# Patient Record
Sex: Female | Born: 1945 | Race: White | Hispanic: No | Smoking: Never smoker
Health system: Southern US, Community
[De-identification: ages and names within clinical notes are randomized; demographics above are authoritative.]

## PROBLEM LIST (undated history)

## (undated) DIAGNOSIS — M810 Age-related osteoporosis without current pathological fracture: Secondary | ICD-10-CM

## (undated) DIAGNOSIS — M1990 Unspecified osteoarthritis, unspecified site: Secondary | ICD-10-CM

## (undated) DIAGNOSIS — K219 Gastro-esophageal reflux disease without esophagitis: Secondary | ICD-10-CM

## (undated) DIAGNOSIS — E785 Hyperlipidemia, unspecified: Secondary | ICD-10-CM

## (undated) DIAGNOSIS — F32A Depression, unspecified: Secondary | ICD-10-CM

## (undated) DIAGNOSIS — R4189 Other symptoms and signs involving cognitive functions and awareness: Secondary | ICD-10-CM

## (undated) DIAGNOSIS — I7300 Raynaud's syndrome without gangrene: Secondary | ICD-10-CM

## (undated) DIAGNOSIS — F419 Anxiety disorder, unspecified: Secondary | ICD-10-CM

## (undated) DIAGNOSIS — R42 Dizziness and giddiness: Secondary | ICD-10-CM

## (undated) DIAGNOSIS — F329 Major depressive disorder, single episode, unspecified: Secondary | ICD-10-CM

## (undated) DIAGNOSIS — I10 Essential (primary) hypertension: Secondary | ICD-10-CM

## (undated) HISTORY — DX: Other symptoms and signs involving cognitive functions and awareness: R41.89

## (undated) HISTORY — DX: Unspecified osteoarthritis, unspecified site: M19.90

## (undated) HISTORY — DX: Essential (primary) hypertension: I10

## (undated) HISTORY — DX: Gastro-esophageal reflux disease without esophagitis: K21.9

## (undated) HISTORY — PX: RHINOPLASTY: SUR1284

## (undated) HISTORY — DX: Raynaud's syndrome without gangrene: I73.00

## (undated) HISTORY — DX: Dizziness and giddiness: R42

## (undated) HISTORY — DX: Anxiety disorder, unspecified: F41.9

## (undated) HISTORY — DX: Hyperlipidemia, unspecified: E78.5

## (undated) HISTORY — DX: Age-related osteoporosis without current pathological fracture: M81.0

## (undated) HISTORY — DX: Depression, unspecified: F32.A

---
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
## 2012-06-09 ENCOUNTER — Ambulatory Visit (HOSPITAL_BASED_OUTPATIENT_CLINIC_OR_DEPARTMENT_OTHER): Admit: 2012-06-09 | Payer: Self-pay | Admitting: Neurosurgery

## 2012-06-09 ENCOUNTER — Inpatient Hospital Stay (HOSPITAL_COMMUNITY): Payer: Medicare Other | Admitting: *Deleted

## 2012-06-09 ENCOUNTER — Encounter (HOSPITAL_COMMUNITY)
Admission: EM | Disposition: A | Discharge status: Home / Self Care | Payer: Self-pay | Source: Home / Self Care | Attending: Neurosurgery

## 2012-06-09 ENCOUNTER — Encounter (HOSPITAL_COMMUNITY): Payer: Self-pay | Admitting: *Deleted

## 2012-06-09 ENCOUNTER — Inpatient Hospital Stay (HOSPITAL_COMMUNITY)
Admission: EM | Admit: 2012-06-09 | Discharge: 2012-06-14 | DRG: 027 | Disposition: A | Discharge status: Home / Self Care | Payer: Medicare Other | Attending: Neurosurgery | Admitting: Neurosurgery

## 2012-06-09 ENCOUNTER — Encounter (HOSPITAL_COMMUNITY): Payer: Self-pay | Admitting: Emergency Medicine

## 2012-06-09 ENCOUNTER — Inpatient Hospital Stay (HOSPITAL_COMMUNITY): Payer: Medicare Other

## 2012-06-09 ENCOUNTER — Emergency Department (HOSPITAL_COMMUNITY): Payer: Medicare Other

## 2012-06-09 DIAGNOSIS — K219 Gastro-esophageal reflux disease without esophagitis: Secondary | ICD-10-CM | POA: Diagnosis present

## 2012-06-09 DIAGNOSIS — R5381 Other malaise: Secondary | ICD-10-CM | POA: Diagnosis present

## 2012-06-09 DIAGNOSIS — S065X9A Traumatic subdural hemorrhage with loss of consciousness of unspecified duration, initial encounter: Secondary | ICD-10-CM

## 2012-06-09 DIAGNOSIS — R5383 Other fatigue: Secondary | ICD-10-CM | POA: Diagnosis present

## 2012-06-09 DIAGNOSIS — H9319 Tinnitus, unspecified ear: Secondary | ICD-10-CM | POA: Diagnosis present

## 2012-06-09 DIAGNOSIS — R29898 Other symptoms and signs involving the musculoskeletal system: Secondary | ICD-10-CM

## 2012-06-09 DIAGNOSIS — S065XAA Traumatic subdural hemorrhage with loss of consciousness status unknown, initial encounter: Secondary | ICD-10-CM

## 2012-06-09 DIAGNOSIS — I9589 Other hypotension: Secondary | ICD-10-CM | POA: Diagnosis not present

## 2012-06-09 DIAGNOSIS — R4789 Other speech disturbances: Secondary | ICD-10-CM | POA: Diagnosis present

## 2012-06-09 DIAGNOSIS — R51 Headache: Secondary | ICD-10-CM

## 2012-06-09 DIAGNOSIS — I6200 Nontraumatic subdural hemorrhage, unspecified: Principal | ICD-10-CM | POA: Diagnosis present

## 2012-06-09 HISTORY — PX: CRANIOTOMY: SHX93

## 2012-06-09 HISTORY — DX: Gastro-esophageal reflux disease without esophagitis: K21.9

## 2012-06-09 LAB — DIFFERENTIAL
Eosinophils Absolute: 0 10*3/uL (ref 0.0–0.7)
Eosinophils Relative: 1 % (ref 0–5)
Lymphocytes Relative: 27 % (ref 12–46)
Lymphs Abs: 2.1 10*3/uL (ref 0.7–4.0)
Monocytes Absolute: 0.4 10*3/uL (ref 0.1–1.0)

## 2012-06-09 LAB — COMPREHENSIVE METABOLIC PANEL
CO2: 28 mEq/L (ref 19–32)
Calcium: 10 mg/dL (ref 8.4–10.5)
Creatinine, Ser: 0.65 mg/dL (ref 0.50–1.10)
GFR calc Af Amer: >90 mL/min (ref >90)
GFR calc non Af Amer: 89 mL/min — ABNORMAL LOW (ref >90)
Glucose, Bld: 137 mg/dL — ABNORMAL HIGH (ref 70–99)
Sodium: 141 mEq/L (ref 135–145)
Total Protein: 7.4 g/dL (ref 6.0–8.3)

## 2012-06-09 LAB — CBC
HCT: 37.8 % (ref 36.0–46.0)
MCH: 30.7 pg (ref 26.0–34.0)
MCV: 91.3 fL (ref 78.0–100.0)
Platelets: 213 10*3/uL (ref 150–400)
RBC: 4.14 MIL/uL (ref 3.87–5.11)
RDW: 13.1 % (ref 11.5–15.5)
WBC: 7.6 10*3/uL (ref 4.0–10.5)

## 2012-06-09 LAB — GLUCOSE, CAPILLARY: Glucose-Capillary: 111 mg/dL — ABNORMAL HIGH (ref 70–99)

## 2012-06-09 LAB — TROPONIN I: Troponin I: <0.3 ng/mL (ref <0.30)

## 2012-06-09 LAB — PROTIME-INR: Prothrombin Time: 13.1 seconds (ref 11.6–15.2)

## 2012-06-09 SURGERY — CANCELLED PROCEDURE
Laterality: Left

## 2012-06-09 SURGERY — CRANIOTOMY HEMATOMA EVACUATION SUBDURAL
Anesthesia: General | Laterality: Left | Wound class: Clean

## 2012-06-09 MED ORDER — PROPOFOL 10 MG/ML IV BOLUS
INTRAVENOUS | Status: DC | PRN
  Administered 2012-06-09: 100 mg via INTRAVENOUS

## 2012-06-09 MED ORDER — HEMOSTATIC AGENTS (NO CHARGE) OPTIME
TOPICAL | Status: DC | PRN
  Administered 2012-06-09: 1 via TOPICAL

## 2012-06-09 MED ORDER — PROMETHAZINE HCL 25 MG/ML IJ SOLN: 6.2500 mg | INTRAMUSCULAR | Status: DC | PRN

## 2012-06-09 MED ORDER — DIPHENHYDRAMINE HCL 50 MG/ML IJ SOLN
25.0000 mg | Freq: Once | INTRAMUSCULAR | Status: AC
  Administered 2012-06-09: 25 mg via INTRAVENOUS
  Filled 2012-06-09: qty 1

## 2012-06-09 MED ORDER — CEFAZOLIN SODIUM-DEXTROSE 2-3 GM-% IV SOLR
INTRAVENOUS | Status: AC
  Administered 2012-06-09: 2 g via INTRAVENOUS
  Filled 2012-06-09: qty 50

## 2012-06-09 MED ORDER — LABETALOL HCL 5 MG/ML IV SOLN: 10.0000 mg | INTRAVENOUS | Status: DC | PRN

## 2012-06-09 MED ORDER — PANTOPRAZOLE SODIUM 40 MG IV SOLR
40.0000 mg | Freq: Every day | INTRAVENOUS | Status: DC
  Administered 2012-06-09: 40 mg via INTRAVENOUS
  Filled 2012-06-09: qty 40

## 2012-06-09 MED ORDER — ONDANSETRON HCL 4 MG/2ML IJ SOLN: 4.0000 mg | Freq: Three times a day (TID) | INTRAMUSCULAR | Status: AC | PRN

## 2012-06-09 MED ORDER — EPHEDRINE SULFATE 50 MG/ML IJ SOLN
INTRAMUSCULAR | Status: DC | PRN
  Administered 2012-06-09 (×4): 5 mg via INTRAVENOUS

## 2012-06-09 MED ORDER — LIDOCAINE HCL 4 % MT SOLN
OROMUCOSAL | Status: DC | PRN
  Administered 2012-06-09: 4 mL via TOPICAL

## 2012-06-09 MED ORDER — PROMETHAZINE HCL 25 MG PO TABS: 12.5000 mg | ORAL_TABLET | ORAL | Status: DC | PRN

## 2012-06-09 MED ORDER — OXYCODONE HCL 5 MG PO TABS: 5.0000 mg | ORAL_TABLET | Freq: Once | ORAL | Status: DC | PRN

## 2012-06-09 MED ORDER — HYDROMORPHONE HCL PF 1 MG/ML IJ SOLN: 0.2500 mg | INTRAMUSCULAR | Status: DC | PRN

## 2012-06-09 MED ORDER — ONDANSETRON HCL 4 MG/2ML IJ SOLN
INTRAMUSCULAR | Status: DC | PRN
  Administered 2012-06-09: 4 mg via INTRAVENOUS

## 2012-06-09 MED ORDER — BUPIVACAINE-EPINEPHRINE PF 0.5-1:200000 % IJ SOLN
INTRAMUSCULAR | Status: DC | PRN
  Administered 2012-06-09: 20 mL

## 2012-06-09 MED ORDER — LIDOCAINE HCL (CARDIAC) 20 MG/ML IV SOLN
INTRAVENOUS | Status: DC | PRN
  Administered 2012-06-09: 100 mg via INTRAVENOUS

## 2012-06-09 MED ORDER — SODIUM CHLORIDE 0.9 % IV SOLN
INTRAVENOUS | Status: DC
  Administered 2012-06-09: 75 mL/h via INTRAVENOUS

## 2012-06-09 MED ORDER — OXYCODONE HCL 5 MG/5ML PO SOLN: 5.0000 mg | Freq: Once | ORAL | Status: DC | PRN

## 2012-06-09 MED ORDER — CEFAZOLIN SODIUM 1-5 GM-% IV SOLN
1.0000 g | Freq: Three times a day (TID) | INTRAVENOUS | Status: AC
  Administered 2012-06-09 – 2012-06-10 (×2): 1 g via INTRAVENOUS
  Filled 2012-06-09 (×2): qty 50

## 2012-06-09 MED ORDER — MORPHINE SULFATE 2 MG/ML IJ SOLN
1.0000 mg | INTRAMUSCULAR | Status: DC | PRN
  Administered 2012-06-09 – 2012-06-10 (×9): 2 mg via INTRAVENOUS
  Filled 2012-06-09 (×9): qty 1

## 2012-06-09 MED ORDER — SURGIFOAM 100 EX MISC
CUTANEOUS | Status: DC | PRN
  Administered 2012-06-09: 14:00:00 via TOPICAL

## 2012-06-09 MED ORDER — METOCLOPRAMIDE HCL 5 MG/ML IJ SOLN
10.0000 mg | Freq: Once | INTRAMUSCULAR | Status: AC
  Administered 2012-06-09: 10 mg via INTRAVENOUS
  Filled 2012-06-09: qty 2

## 2012-06-09 MED ORDER — MORPHINE SULFATE 4 MG/ML IJ SOLN: 4.0000 mg | INTRAMUSCULAR | Status: DC | PRN

## 2012-06-09 MED ORDER — ONDANSETRON HCL 4 MG/2ML IJ SOLN: 4.0000 mg | INTRAMUSCULAR | Status: DC | PRN

## 2012-06-09 MED ORDER — SODIUM CHLORIDE 0.9 % IV SOLN
INTRAVENOUS | Status: DC | PRN
  Administered 2012-06-09: 14:00:00 via INTRAVENOUS

## 2012-06-09 MED ORDER — SODIUM CHLORIDE 0.9 % IV SOLN
500.0000 mg | Freq: Two times a day (BID) | INTRAVENOUS | Status: DC
  Administered 2012-06-09 – 2012-06-10 (×2): 500 mg via INTRAVENOUS
  Filled 2012-06-09 (×3): qty 5

## 2012-06-09 MED ORDER — ONDANSETRON HCL 4 MG PO TABS: 4.0000 mg | ORAL_TABLET | ORAL | Status: DC | PRN

## 2012-06-09 MED ORDER — NEOSTIGMINE METHYLSULFATE 1 MG/ML IJ SOLN
INTRAMUSCULAR | Status: DC | PRN
  Administered 2012-06-09: 3 mg via INTRAVENOUS

## 2012-06-09 MED ORDER — MORPHINE SULFATE 2 MG/ML IJ SOLN
INTRAMUSCULAR | Status: AC
  Administered 2012-06-09: 2 mg via INTRAVENOUS
  Filled 2012-06-09: qty 1

## 2012-06-09 MED ORDER — FENTANYL CITRATE 0.05 MG/ML IJ SOLN
INTRAMUSCULAR | Status: DC | PRN
  Administered 2012-06-09: 50 ug via INTRAVENOUS
  Administered 2012-06-09: 100 ug via INTRAVENOUS
  Administered 2012-06-09: 50 ug via INTRAVENOUS

## 2012-06-09 MED ORDER — 0.9 % SODIUM CHLORIDE (POUR BTL) OPTIME
TOPICAL | Status: DC | PRN
  Administered 2012-06-09: 1000 mL

## 2012-06-09 MED ORDER — THROMBIN 5000 UNITS EX SOLR
CUTANEOUS | Status: DC | PRN
  Administered 2012-06-09 (×2): 5000 [IU] via TOPICAL

## 2012-06-09 MED ORDER — ROCURONIUM BROMIDE 100 MG/10ML IV SOLN
INTRAVENOUS | Status: DC | PRN
  Administered 2012-06-09: 35 mg via INTRAVENOUS

## 2012-06-09 MED ORDER — GLYCOPYRROLATE 0.2 MG/ML IJ SOLN
INTRAMUSCULAR | Status: DC | PRN
  Administered 2012-06-09: 0.4 mg via INTRAVENOUS

## 2012-06-09 MED ORDER — SODIUM CHLORIDE 0.9 % IV SOLN
INTRAVENOUS | Status: DC
  Administered 2012-06-11 (×2): via INTRAVENOUS

## 2012-06-09 SURGICAL SUPPLY — 74 items
BANDAGE GAUZE 4  KLING STR (GAUZE/BANDAGES/DRESSINGS) IMPLANT
BENZOIN TINCTURE PRP APPL 2/3 (GAUZE/BANDAGES/DRESSINGS) ×2 IMPLANT
BIT DRILL WIRE PASS 1.3MM (BIT) IMPLANT
BLADE SURG ROTATE 9660 (MISCELLANEOUS) IMPLANT
BRUSH SCRUB EZ PLAIN DRY (MISCELLANEOUS) ×2 IMPLANT
BUR ACORN 6.0 PRECISION (BURR) ×2 IMPLANT
BUR ROUTER D-58 CRANI (BURR) IMPLANT
CANISTER SUCTION 2500CC (MISCELLANEOUS) ×2 IMPLANT
CLOTH BEACON ORANGE TIMEOUT ST (SAFETY) ×2 IMPLANT
CONT SPEC 4OZ CLIKSEAL STRL BL (MISCELLANEOUS) ×2 IMPLANT
CORDS BIPOLAR (ELECTRODE) ×4 IMPLANT
DRAIN JACKSON PRATT 10MM FLAT (MISCELLANEOUS) ×2 IMPLANT
DRAPE SURG IRRIG POUCH 19X23 (DRAPES) IMPLANT
DRAPE WARM FLUID 44X44 (DRAPE) ×2 IMPLANT
DRILL WIRE PASS 1.3MM (BIT)
DRSG PAD ABDOMINAL 8X10 ST (GAUZE/BANDAGES/DRESSINGS) IMPLANT
DURAPREP 6ML APPLICATOR 50/CS (WOUND CARE) ×2 IMPLANT
ELECT CAUTERY BLADE 6.4 (BLADE) ×2 IMPLANT
ELECT REM PT RETURN 9FT ADLT (ELECTROSURGICAL) ×2
ELECTRODE REM PT RTRN 9FT ADLT (ELECTROSURGICAL) ×1 IMPLANT
EVACUATOR 1/8 PVC DRAIN (DRAIN) IMPLANT
EVACUATOR 3/16  PVC DRAIN (DRAIN) ×1
EVACUATOR 3/16 PVC DRAIN (DRAIN) ×1 IMPLANT
EVACUATOR SILICONE 100CC (DRAIN) IMPLANT
GAUZE SPONGE 4X4 16PLY XRAY LF (GAUZE/BANDAGES/DRESSINGS) IMPLANT
GLOVE BIOGEL M 8.0 STRL (GLOVE) ×2 IMPLANT
GLOVE BIOGEL PI IND STRL 7.5 (GLOVE) ×1 IMPLANT
GLOVE BIOGEL PI IND STRL 8 (GLOVE) ×1 IMPLANT
GLOVE BIOGEL PI IND STRL 8.5 (GLOVE) ×1 IMPLANT
GLOVE BIOGEL PI INDICATOR 7.5 (GLOVE) ×1
GLOVE BIOGEL PI INDICATOR 8 (GLOVE) ×1
GLOVE BIOGEL PI INDICATOR 8.5 (GLOVE) ×1
GLOVE ECLIPSE 8.5 STRL (GLOVE) ×2 IMPLANT
GLOVE EXAM NITRILE LRG STRL (GLOVE) ×2 IMPLANT
GLOVE EXAM NITRILE MD LF STRL (GLOVE) IMPLANT
GLOVE EXAM NITRILE XL STR (GLOVE) IMPLANT
GLOVE EXAM NITRILE XS STR PU (GLOVE) IMPLANT
GLOVE INDICATOR 7.5 STRL GRN (GLOVE) ×2 IMPLANT
GLOVE INDICATOR 8.5 STRL (GLOVE) ×2 IMPLANT
GOWN BRE IMP SLV AUR LG STRL (GOWN DISPOSABLE) ×2 IMPLANT
GOWN BRE IMP SLV AUR XL STRL (GOWN DISPOSABLE) ×2 IMPLANT
GOWN STRL REIN 2XL LVL4 (GOWN DISPOSABLE) ×4 IMPLANT
HEMOSTAT POWDER SURGIFOAM 1G (HEMOSTASIS) ×4 IMPLANT
HEMOSTAT SURGICEL 2X14 (HEMOSTASIS) ×2 IMPLANT
HOOK DURA (MISCELLANEOUS) ×2 IMPLANT
KIT BASIN OR (CUSTOM PROCEDURE TRAY) ×2 IMPLANT
KIT ROOM TURNOVER OR (KITS) ×2 IMPLANT
NEEDLE HYPO 22GX1.5 SAFETY (NEEDLE) ×2 IMPLANT
NS IRRIG 1000ML POUR BTL (IV SOLUTION) ×2 IMPLANT
PACK CRANIOTOMY (CUSTOM PROCEDURE TRAY) ×2 IMPLANT
PAD ARMBOARD 7.5X6 YLW CONV (MISCELLANEOUS) ×2 IMPLANT
PATTIES SURGICAL .5 X.5 (GAUZE/BANDAGES/DRESSINGS) IMPLANT
PATTIES SURGICAL .5 X3 (DISPOSABLE) ×2 IMPLANT
PATTIES SURGICAL 1X1 (DISPOSABLE) IMPLANT
PIN MAYFIELD SKULL DISP (PIN) IMPLANT
PLATE 1.5  2HOLE LNG NEURO (Plate) ×1 IMPLANT
PLATE 1.5 2HOLE LNG NEURO (Plate) ×1 IMPLANT
PLATE 1.5/0.5 13MM BURR HOLE (Plate) ×4 IMPLANT
SCREW SELF DRILL HT 1.5/4MM (Screw) ×18 IMPLANT
SPONGE GAUZE 4X4 12PLY (GAUZE/BANDAGES/DRESSINGS) ×2 IMPLANT
SPONGE NEURO XRAY DETECT 1X3 (DISPOSABLE) IMPLANT
SPONGE SURGIFOAM ABS GEL 100 (HEMOSTASIS) ×2 IMPLANT
STAPLER SKIN PROX WIDE 3.9 (STAPLE) ×2 IMPLANT
SUT ETHILON 4 0 PS 2 18 (SUTURE) ×2 IMPLANT
SUT NURALON 4 0 TR CR/8 (SUTURE) ×4 IMPLANT
SUT VIC AB 2-0 CP2 18 (SUTURE) ×4 IMPLANT
SYR 20ML ECCENTRIC (SYRINGE) ×2 IMPLANT
SYR CONTROL 10ML LL (SYRINGE) ×2 IMPLANT
TOWEL OR 17X24 6PK STRL BLUE (TOWEL DISPOSABLE) ×2 IMPLANT
TOWEL OR 17X26 10 PK STRL BLUE (TOWEL DISPOSABLE) ×2 IMPLANT
TRAY FOLEY CATH 14FRSI W/METER (CATHETERS) IMPLANT
TUBE CONNECTING 12X1/4 (SUCTIONS) ×2 IMPLANT
UNDERPAD 30X30 INCONTINENT (UNDERPADS AND DIAPERS) IMPLANT
WATER STERILE IRR 1000ML POUR (IV SOLUTION) ×2 IMPLANT

## 2012-06-09 NOTE — Progress Notes (Signed)
At 13:15 pt arrived from [facility], and stopped momentarily into 3108, but was ordered straight to Neuro Periop by Dr. Jeral Fruit and Gearldine Bienenstock RN. Pt was alert, oriented, moving all extremities, rt leg weak. Pt did report a headache. Botero at bedside. At 13:25, pts NOK sister arrived and was taken to pts side in periop area.

## 2012-06-09 NOTE — OR Nursing (Signed)
Patient ring given to her siter Duanne Guess by [facility] RN

## 2012-06-09 NOTE — Anesthesia Postprocedure Evaluation (Signed)
  Anesthesia Post-op Note  Patient: Jenna Booth  Procedure(s) Performed: Procedure(s): CRANIOTOMY HEMATOMA EVACUATION SUBDURAL (Left)  Patient Location: PACU  Anesthesia Type:General  Level of Consciousness: awake and oriented  Airway and Oxygen Therapy: Patient Spontanous Breathing  Post-op Pain: mild  Post-op Assessment: Post-op Vital signs reviewed  Post-op Vital Signs: stable  Complications: No apparent anesthesia complications

## 2012-06-09 NOTE — Anesthesia Preprocedure Evaluation (Signed)
Anesthesia Evaluation  Patient identified by MRN, date of birth, ID band Patient awake    Reviewed: Allergy & Precautions, H&P , Patient's Chart, lab work & pertinent test results  History of Anesthesia Complications Negative for: history of anesthetic complications  Airway Mallampati: II      Dental  (+) Teeth Intact   Pulmonary          Cardiovascular negative cardio ROS  Rhythm:Regular Rate:Normal     Neuro/Psych  Headaches, Left subdural  c midline shift on CT. She is awake and oriented at this time.    GI/Hepatic GERD-  ,  Endo/Other    Renal/GU      Musculoskeletal   Abdominal (+) + obese,   Peds  Hematology   Anesthesia Other Findings   Reproductive/Obstetrics                           Anesthesia Physical Anesthesia Plan  ASA: II and emergent  Anesthesia Plan: General   Post-op Pain Management:    Induction: Intravenous  Airway Management Planned: Oral ETT  Additional Equipment: Arterial line  Intra-op Plan:   Post-operative Plan: Extubation in OR  Informed Consent: I have reviewed the patients History and Physical, chart, labs and discussed the procedure including the risks, benefits and alternatives for the proposed anesthesia with the patient or authorized representative who has indicated his/her understanding and acceptance.   Dental advisory given  Plan Discussed with: CRNA and Surgeon  Anesthesia Plan Comments:         Anesthesia Quick Evaluation

## 2012-06-09 NOTE — ED Notes (Signed)
Pt reports fell approx a week ago and has bruising to left leg and hip.

## 2012-06-09 NOTE — ED Provider Notes (Signed)
History  This chart was scribed for Ward Givens, MD by Bennett Scrape, ED Scribe. This patient was seen in room APA02/APA02 and the patient's care was started at 9:45 AM.  CSN: [id number]  Arrival date & time 06/09/12  0927   First MD Initiated Contact with Patient 06/09/12 [phone]      Chief Complaint  Patient presents with  . Headache     Patient is a 67 y.o. female presenting with headaches. The history is provided by the patient. No language interpreter was used.  Headache Pain location:  L temporal Quality:  Sharp Radiates to:  Does not radiate Severity currently:  5/10 Severity at highest:  10/10 Onset quality:  Gradual Duration:  2 weeks Timing:  Constant Progression:  Waxing and waning Chronicity:  New Relieved by: OTC medications. Exacerbated by: bending over. Associated symptoms: no cough, no numbness and no photophobia     HPI Comments: Jenna Booth is a 67 y.o. female who presents to the Emergency Department complaining of 2 weeks of  gradual onset, gradually worsening, waxing and waning left-sided HA described as sharp with one week of associated trouble lifting her right leg and dysphasia described as trouble finding the right word. She rates her pain a 5 out of 10 currently and a 10 out of 10 at its worst, last episode of 10/10 pain was upon waking this morning. The pain is aggravated by bending over but denies any alleviating factors and denies that loud sounds and bright lights worsen her symptoms. She reports that she also has chronic tinnitus described as high pitched but has been worse since the onset. She admits that the left eye "is not as good as it used to be" but denies any new visual changes. She reports taking OTC medications with moderate improvement. Pt denies having a h/o migraines or frequent HAs. She denies having a family h/o brain tumors, CVA or migraines.  She states that she has experienced one prior episode of a bad headache many years ago  diagnosed as a sinus infection that cleared with antibiotics. She denies fevers, photophobia, sore throat, rhinorrhea, nasal congestion, fever, cough and numbness as associated symptoms. She denies having a h/o heart or lung problems, HTN, HLD and DM. Mother died at 91 from ulcerative colitis and father at 71 from lung CA. No FH of strokes, brain tumors or headaches.   She has a h/o GERD which she takes Prilosec for. Pt denies smoking and alcohol use.  Pt denies having a PCP currently  Moved from [place] on April 14th, 2014.  Past Medical History  Diagnosis Date  . Acid reflux     Past Surgical History  Procedure Laterality Date  . Rhinoplasty      History reviewed. No pertinent family history.  History  Substance Use Topics  . Smoking status: Never Smoker   . Smokeless tobacco: Never Used  . Alcohol Use: No  lives alone Moved 1 month ago from [place] after husband died  No OB history provided.  Review of Systems  HENT: Positive for rhinorrhea, sneezing and tinnitus (left).   Eyes: Negative for photophobia and visual disturbance.  Respiratory: Negative for cough and shortness of breath.   Neurological: Positive for weakness and headaches. Negative for numbness.  All other systems reviewed and are negative.    Allergies  Review of patient's allergies indicates no known allergies.  Home Medications   Current Outpatient Rx  Name  Route  Sig  Dispense  Refill  .  aspirin EC 81 MG tablet   Oral   Take 81 mg by mouth daily.         . fish oil-omega-3 fatty acids 1000 MG capsule   Oral   Take 2 g by mouth daily.         . Multiple Vitamin (MULTIVITAMIN WITH MINERALS) TABS   Oral   Take 1 tablet by mouth daily.         Marland Kitchen omeprazole (PRILOSEC) 10 MG capsule   Oral   Take 10 mg by mouth daily.         . Probiotic Product (PROBIOTIC DAILY PO)   Oral   Take 1 tablet by mouth daily.           Triage Vitals: BP 153/74  Pulse 71  Temp(Src) 98.3 F (36.8 C)  (Oral)  Resp 16  SpO2 100%  Vital signs normal mild hypertension   Physical Exam  Nursing note and vitals reviewed. Constitutional: She is oriented to person, place, and time. She appears well-developed and well-nourished.  Non-toxic appearance. She does not appear ill. No distress.  HENT:  Head: Normocephalic and atraumatic.  Right Ear: External ear normal.  Left Ear: External ear normal.  Nose: Nose normal. No mucosal edema or rhinorrhea.  Mouth/Throat: Oropharynx is clear and moist and mucous membranes are normal. No dental abscesses or edematous.  Eyes: Conjunctivae and EOM are normal. Pupils are equal, round, and reactive to light.  No visual field deficits, left pupil is 1 mm larger than the right but both are reactive   Neck: Normal range of motion and full passive range of motion without pain. Neck supple.  Cardiovascular: Normal rate, regular rhythm and normal heart sounds.  Exam reveals no gallop and no friction rub.   No murmur heard. Pulmonary/Chest: Effort normal and breath sounds normal. No respiratory distress. She has no wheezes. She has no rhonchi. She has no rales. She exhibits no tenderness and no crepitus.  Abdominal: Soft. Normal appearance and bowel sounds are normal. She exhibits no distension. There is no tenderness. There is no rebound and no guarding.  Musculoskeletal: Normal range of motion. She exhibits no edema and no tenderness.  Moves all extremities well.   Neurological: She is alert and oriented to person, place, and time. She has normal strength. No cranial nerve deficit.  Grips are equal, no pronator drift, difficulty performing heel to shin with right leg, has difficulty raising and holding her RLE against gravity. Her LLE is normal in strength.  Skin: Skin is warm, dry and intact. No rash noted. No erythema. No pallor.  Varicose veins to bilateral lower legs, ecchymosis to left knee and left proximal leg, less ecchymosis to the right proximal leg,  ecchymosis to right elbow  Psychiatric: She has a normal mood and affect. Her speech is normal and behavior is normal. Her mood appears not anxious.    ED Course  Procedures (including critical care time)  Medications  metoCLOPramide (REGLAN) injection 10 mg (10 mg Intravenous Given 06/09/12 1022)  diphenhydrAMINE (BENADRYL) injection 25 mg (25 mg Intravenous Given 06/09/12 1022)    DIAGNOSTIC STUDIES: Oxygen Saturation is 100% on room air, normal by my interpretation.    COORDINATION OF CARE: 10:00 AM-Discussed treatment plan which includes medications, CT of head, CBC panel, CMP and troponin  with pt at bedside and pt agreed to plan.   10:22 AM- Dr Si Gaul, Radiologist called about SDH  10:38 AM-Pt informed of CT of head results showing  SDH and reviewed CT scan in the room with the pt. Pt denies any recent falls where she hit her head. Discussed transport to [facility] to see Neurosurgery and pt agreed.  11:14 AM-Consult complete with Dr. Jeral Fruit, neurosurgeon. Patient case explained and discussed. Dr. Jeral Fruit agrees to admit patient to neuro ICU at [facility] for further evaluation and treatment. Call ended at 11:15 AM.  11:23 AM- Informed pt of transfer to neuro ICU at [facility] with plans to have surgery performed by Dr. Jeral Fruit today. Pt is agreeable to the plan.  Results for orders placed during the hospital encounter of 06/09/12  [facility]      Result Value Range   Prothrombin Time 13.1  11.6 - 15.2 seconds   INR 1.00  0.00 - 1.49  APTT      Result Value Range   aPTT 30  24 - 37 seconds  CBC      Result Value Range   WBC 7.6  4.0 - 10.5 K/uL   RBC 4.14  3.87 - 5.11 MIL/uL   Hemoglobin 12.7  12.0 - 15.0 g/dL   HCT 43.3  29.5 - 18.8 %   MCV 91.3  78.0 - 100.0 fL   MCH 30.7  26.0 - 34.0 pg   MCHC 33.6  30.0 - 36.0 g/dL   RDW 41.6  60.6 - 30.1 %   Platelets 213  150 - 400 K/uL  DIFFERENTIAL      Result Value Range   Neutrophils Relative 66  43 - 77 %   Neutro Abs 5.0  1.7 - 7.7 K/uL    Lymphocytes Relative 27  12 - 46 %   Lymphs Abs 2.1  0.7 - 4.0 K/uL   Monocytes Relative 6  3 - 12 %   Monocytes Absolute 0.4  0.1 - 1.0 K/uL   Eosinophils Relative 1  0 - 5 %   Eosinophils Absolute 0.0  0.0 - 0.7 K/uL   Basophils Relative 0  0 - 1 %   Basophils Absolute 0.0  0.0 - 0.1 K/uL  COMPREHENSIVE METABOLIC PANEL      Result Value Range   Sodium 141  135 - 145 mEq/L   Potassium 4.6  3.5 - 5.1 mEq/L   Chloride 106  96 - 112 mEq/L   CO2 28  19 - 32 mEq/L   Glucose, Bld 137 (*) 70 - 99 mg/dL   BUN 27 (*) 6 - 23 mg/dL   Creatinine, Ser 6.01  0.50 - 1.10 mg/dL   Calcium 09.3  8.4 - 23.5 mg/dL   Total Protein 7.4  6.0 - 8.3 g/dL   Albumin 4.4  3.5 - 5.2 g/dL   AST 18  0 - 37 U/L   ALT 15  0 - 35 U/L   Alkaline Phosphatase 69  39 - 117 U/L   Total Bilirubin 0.5  0.3 - 1.2 mg/dL   GFR calc non Af Amer 89 (*) >90 mL/min   GFR calc Af Amer >90  >90 mL/min  TROPONIN I      Result Value Range   Troponin I <0.30  <0.30 ng/mL  GLUCOSE, CAPILLARY      Result Value Range   Glucose-Capillary 111 (*) 70 - 99 mg/dL   Laboratory interpretation all normal except hyperglycemia   Ct Head (brain) Wo Contrast  06/09/2012  *RADIOLOGY REPORT*  Clinical Data: 67 year old female with dizzy mass, altered mental status, left-sided headache and right-sided weakness.  CT HEAD WITHOUT CONTRAST  Technique:  Contiguous axial images were obtained from the base of the skull through the vertex without contrast.  Comparison: None  Findings: A left subdural collection measuring 1.8 cm is noted of varying densities with acute to subacute blood noted posteriorly. This subdural collection causes 1 cm left to right midline shift and right lateral ventriculomegaly. There is no evidence of mass lesion, acute infarction or intraparenchymal hemorrhage.  No acute or suspicious bony abnormalities are identified.  IMPRESSION: 1.8 cm mixed age left subdural hematoma with acute to subacute blood noted posteriorly.  This  subdural hematoma causes 1 cm left to right midline shift and right lateral ventriculomegaly.  Critical Value/emergent results were called by telephone at the time of interpretation on 06/09/2012 at 10:20 a.m. to Dr. Lynelle Doctor, who verbally acknowledged these results.   Original Report Authenticated By: Harmon Pier, M.D.    C Dg Chest Portable 1 View  06/09/2012  *RADIOLOGY REPORT*  Clinical Data: Weakness, headache and left leg weakness.  PORTABLE CHEST - 1 VIEW  Comparison: None  Findings: The cardiomediastinal silhouette is unremarkable. The lungs are clear. There is no evidence of focal airspace disease, pulmonary edema, suspicious pulmonary nodule/mass, pleural effusion, or pneumothorax. No acute bony abnormalities are identified.  IMPRESSION: No evidence of active cardiopulmonary disease.   Original Report Authenticated By: Harmon Pier, M.D.      Date: 06/09/2012  Rate: 69  Rhythm: normal sinus rhythm  QRS Axis: normal  Intervals: normal  ST/T Wave abnormalities: normal  Conduction Disutrbances:none  Narrative Interpretation: PRWP  Old EKG Reviewed: none available     1. SDH (subdural hematoma)   2. Headache   3. Weakness of right leg    Plan transfer to [facility] to admission/surgery for her SDH   Devoria Albe, MD, FACEP  CRITICAL CARE Performed by: Devoria Albe L Total critical care time: 34 min Critical care time was exclusive of separately billable procedures and treating other patients. Critical care was necessary to treat or prevent imminent or life-threatening deterioration. Critical care was time spent personally by me on the following activities: development of treatment plan with patient and/or surrogate as well as nursing, discussions with consultants, evaluation of patient's response to treatment, examination of patient, obtaining history from patient or surrogate, ordering and performing treatments and interventions, ordering and review of laboratory studies, ordering and review of  radiographic studies, pulse oximetry and re-evaluation of patient's condition.    MDM  patient presented with left-sided headache and right lower extremities weakness that was suspicious for possible brain tumor that has been developing over the last couple weeks. However she does have a subdural hematoma that is going to require surgical intervention. Patient is not on any anticoagulation medications. She also denies any trauma before she started having her headache.    I personally performed the services described in this documentation, which was scribed in my presence. The recorded information has been reviewed and considered.  Devoria Albe, MD, Armando Gang        Ward Givens, MD 06/09/12 [phone]

## 2012-06-09 NOTE — ED Notes (Signed)
Report given to [facility], waiting for [facility] arrival.  Pt and family aware.   All of pt's  Belongings went with pt's sister.

## 2012-06-09 NOTE — Consult Note (Signed)
Reason for Consult:sdh Referring Physician: er a. penn  Jenna Booth is an 67 y.o. female.  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 She has had headache for several weeks, getting worse lately associated with weakness of right leg  Past Medical History  Diagnosis Date  . Acid reflux     Past Surgical History  Procedure Laterality Date  . Rhinoplasty      History reviewed. No pertinent family history.  Social History:  reports that she has never smoked. She has never used smokeless tobacco. She reports that she does not drink alcohol or use illicit drugs.  Allergies: No Known Allergies  Medications: see H/P  Results for orders placed during the hospital encounter of 06/09/12 (from the past 48 hour(s))  PROTIME-INR     Status: None   Collection Time    06/09/12  9:41 AM      Result Value Range   Prothrombin Time 13.1  11.6 - 15.2 seconds   INR 1.00  0.00 - 1.49  APTT     Status: None   Collection Time    06/09/12  9:41 AM      Result Value Range   aPTT 30  24 - 37 seconds  CBC     Status: None   Collection Time    06/09/12  9:41 AM      Result Value Range   WBC 7.6  4.0 - 10.5 K/uL   RBC 4.14  3.87 - 5.11 MIL/uL   Hemoglobin 12.7  12.0 - 15.0 g/dL   HCT 40.9  81.1 - 91.4 %   MCV 91.3  78.0 - 100.0 fL   MCH 30.7  26.0 - 34.0 pg   MCHC 33.6  30.0 - 36.0 g/dL   RDW 78.2  95.6 - 21.3 %   Platelets 213  150 - 400 K/uL  DIFFERENTIAL     Status: None   Collection Time    06/09/12  9:41 AM      Result Value Range   Neutrophils Relative 66  43 - 77 %   Neutro Abs 5.0  1.7 - 7.7 K/uL   Lymphocytes Relative 27  12 - 46 %   Lymphs Abs 2.1  0.7 - 4.0 K/uL   Monocytes Relative 6  3 - 12 %   Monocytes Absolute 0.4  0.1 - 1.0 K/uL   Eosinophils Relative 1  0 - 5 %   Eosinophils Absolute 0.0  0.0 - 0.7 K/uL   Basophils Relative 0  0 - 1 %   Basophils Absolute 0.0  0.0 - 0.1 K/uL   COMPREHENSIVE METABOLIC PANEL     Status: Abnormal   Collection Time    06/09/12  9:41 AM      Result Value Range   Sodium 141  135 - 145 mEq/L   Potassium 4.6  3.5 - 5.1 mEq/L   Chloride 106  96 - 112 mEq/L   CO2 28  19 - 32 mEq/L   Glucose, Bld 137 (*) 70 - 99 mg/dL   BUN 27 (*) 6 - 23 mg/dL   Creatinine, Ser 0.86  0.50 - 1.10 mg/dL   Calcium 57.8  8.4 - 46.9 mg/dL   Total Protein 7.4  6.0 - 8.3 g/dL   Albumin 4.4  3.5 - 5.2 g/dL   AST 18  0 - 37 U/L   ALT 15  0 -  35 U/L   Alkaline Phosphatase 69  39 - 117 U/L   Total Bilirubin 0.5  0.3 - 1.2 mg/dL   GFR calc non Af Amer 89 (*) >90 mL/min   GFR calc Af Amer >90  >90 mL/min   Comment:            The eGFR has been calculated     using the CKD EPI equation.     This calculation has not been     validated in all clinical     situations.     eGFR's persistently     <90 mL/min signify     possible Chronic Kidney Disease.  TROPONIN I     Status: None   Collection Time    06/09/12  9:41 AM      Result Value Range   Troponin I <0.30  <0.30 ng/mL   Comment:            Due to the release kinetics of cTnI,     a negative result within the first hours     of the onset of symptoms does not rule out     myocardial infarction with certainty.     If myocardial infarction is still suspected,     repeat the test at appropriate intervals.  GLUCOSE, CAPILLARY     Status: Abnormal   Collection Time    06/09/12 10:28 AM      Result Value Range   Glucose-Capillary 111 (*) 70 - 99 mg/dL    Ct Head (brain) Wo Contrast  06/09/2012  *RADIOLOGY REPORT*  Clinical Data: 67 year old female with dizzy mass, altered mental status, left-sided headache and right-sided weakness.  CT HEAD WITHOUT CONTRAST  Technique:  Contiguous axial images were obtained from the base of the skull through the vertex without contrast.  Comparison: None  Findings: A left subdural collection measuring 1.8 cm is noted of varying densities with acute to subacute blood  noted posteriorly. This subdural collection causes 1 cm left to right midline shift and right lateral ventriculomegaly. There is no evidence of mass lesion, acute infarction or intraparenchymal hemorrhage.  No acute or suspicious bony abnormalities are identified.  IMPRESSION: 1.8 cm mixed age left subdural hematoma with acute to subacute blood noted posteriorly.  This subdural hematoma causes 1 cm left to right midline shift and right lateral ventriculomegaly.  Critical Value/emergent results were called by telephone at the time of interpretation on 06/09/2012 at 10:20 a.m. to Dr. Lynelle Doctor, who verbally acknowledged these results.   Original Report Authenticated By: Harmon Pier, M.D.    Dg Chest Portable 1 View  06/09/2012  *RADIOLOGY REPORT*  Clinical Data: Weakness, headache and left leg weakness.  PORTABLE CHEST - 1 VIEW  Comparison: None  Findings: The cardiomediastinal silhouette is unremarkable. The lungs are clear. There is no evidence of focal airspace disease, pulmonary edema, suspicious pulmonary nodule/mass, pleural effusion, or pneumothorax. No acute bony abnormalities are identified.  IMPRESSION: No evidence of active cardiopulmonary disease.   Original Report Authenticated By: Harmon Pier, M.D.     Review of Systems  Constitutional: Negative.   Eyes: Positive for blurred vision.  Respiratory: Negative.   Cardiovascular: Negative.   Gastrointestinal: Negative.   Genitourinary: Negative.   Musculoskeletal: Negative.   Skin: Negative.   Neurological: Positive for focal weakness and headaches.  Endo/Heme/Allergies: Negative.   Psychiatric/Behavioral: Negative.    Blood pressure 123/70, pulse 58, temperature 98.2 F (36.8 C), temperature source Oral, resp. rate 18, SpO2 100.00%. Physical  Exam hent, nl. Neck, nl. Cv,nl. Lungs, clear. Abdomen,soft. Extremities, nl pulses. Grade 1 edema. NEURO oriented x 3. Cn, nl. Strength weakness of right leg3/5 sensory, nl. Gait no tested Ct head shows a  large SDH chronic with acute component. i did talk to her and showed the ct to her sister and husband.  Assessment/Plan: For left craniotomy asap. Patient and family aware of risks and benefits suchs as paralysis, infection, seizures, need for further craniotomies  Jenna Booth M 06/09/2012, 1:17 PM

## 2012-06-09 NOTE — Plan of Care (Signed)
Problem: Consults Goal: Diagnosis - Craniotomy Outcome: Completed/Met Date Met:  06/09/12 Craniotomy for subdural hematoma

## 2012-06-09 NOTE — ED Notes (Signed)
Pt states that she has been feeling weak, dizzy, and her friend reports confusion, since the pt moved here in April.

## 2012-06-09 NOTE — Progress Notes (Signed)
Op note [phone]

## 2012-06-09 NOTE — Transfer of Care (Signed)
Immediate Anesthesia Transfer of Care Note  Patient: Jenna Booth  Procedure(s) Performed: Procedure(s): CRANIOTOMY HEMATOMA EVACUATION SUBDURAL (Left)  Patient Location: PACU  Anesthesia Type:General  Level of Consciousness: awake and alert   Airway & Oxygen Therapy: Patient Spontanous Breathing and Patient connected to nasal cannula oxygen  Post-op Assessment: Report given to PACU RN and Patient moving all extremities X 4  Post vital signs: Reviewed and stable  Complications: No apparent anesthesia complications

## 2012-06-10 ENCOUNTER — Encounter (HOSPITAL_COMMUNITY): Payer: Self-pay | Admitting: Neurosurgery

## 2012-06-10 LAB — CBC
HCT: 32.9 % — ABNORMAL LOW (ref 36.0–46.0)
Hemoglobin: 11.1 g/dL — ABNORMAL LOW (ref 12.0–15.0)
MCH: 30.1 pg (ref 26.0–34.0)
MCHC: 33.7 g/dL (ref 30.0–36.0)
RBC: 3.69 MIL/uL — ABNORMAL LOW (ref 3.87–5.11)

## 2012-06-10 LAB — BASIC METABOLIC PANEL
BUN: 17 mg/dL (ref 6–23)
Chloride: 107 mEq/L (ref 96–112)
Glucose, Bld: 111 mg/dL — ABNORMAL HIGH (ref 70–99)
Potassium: 3.5 mEq/L (ref 3.5–5.1)

## 2012-06-10 MED ORDER — PANTOPRAZOLE SODIUM 40 MG PO TBEC
40.0000 mg | DELAYED_RELEASE_TABLET | Freq: Every day | ORAL | Status: DC
  Administered 2012-06-10 – 2012-06-13 (×4): 40 mg via ORAL
  Filled 2012-06-10 (×4): qty 1

## 2012-06-10 MED ORDER — HYDROCODONE-ACETAMINOPHEN 5-325 MG PO TABS
1.0000 | ORAL_TABLET | Freq: Four times a day (QID) | ORAL | Status: DC | PRN
  Administered 2012-06-10: 2 via ORAL
  Administered 2012-06-11 – 2012-06-12 (×6): 1 via ORAL
  Administered 2012-06-13 (×2): 2 via ORAL
  Administered 2012-06-13: 1 via ORAL
  Administered 2012-06-14: 2 via ORAL
  Filled 2012-06-10: qty 2
  Filled 2012-06-10: qty 1
  Filled 2012-06-10: qty 2
  Filled 2012-06-10: qty 1
  Filled 2012-06-10 (×3): qty 2
  Filled 2012-06-10 (×3): qty 1

## 2012-06-10 MED ORDER — LEVETIRACETAM 500 MG PO TABS
500.0000 mg | ORAL_TABLET | Freq: Two times a day (BID) | ORAL | Status: DC
  Administered 2012-06-10 – 2012-06-14 (×8): 500 mg via ORAL
  Filled 2012-06-10 (×9): qty 1

## 2012-06-10 MED ORDER — POLYETHYLENE GLYCOL 3350 17 G PO PACK
17.0000 g | PACK | Freq: Every day | ORAL | Status: DC
  Administered 2012-06-10 – 2012-06-14 (×5): 17 g via ORAL
  Filled 2012-06-10 (×5): qty 1

## 2012-06-10 NOTE — Progress Notes (Signed)
Patient ID: Jenna Booth, female   DOB: [birth year], 67 y.o.   MRN: [id number] Alert, oriented, c/o incisional pain. No weakness as preop. Drain working well. See orders

## 2012-06-10 NOTE — Op Note (Signed)
NAMESHANN, LEWELLYN NO.:  [id number]  MEDICAL RECORD NO.:  [id number]  LOCATION:  3108                         FACILITY:  [facility]  PHYSICIAN:  Hilda Lias, M.D.   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  PROCEDURE:  Left frontoparietal craniotomy, evacuation of chronic and acute subdural hematoma.  SURGEON:  Hilda Lias, M.D.  ASSISTANT:  Kathaleen Maser. Pool, M.D.  CLINICAL HISTORY:  Mr. Friddle is a lady, who was admitted this morning through the emergency room at [facility] after she went complaining of headache.  The headache had been going on for several weeks, but it is getting intense lately associated with weakness of the right leg.  CT scan show chronic and recent subdural hematoma with 11 mm shift.  I talked to her and her sister and brother-in-law and we talked about the need for surgery because of the weakness.  The risks were fully explained to them including the possibility of paralysis, reaccumulation of the fluid with need of further surgery, and future infection.  PROCEDURE IN DETAIL:  The patient was taken to the OR, and after intubation, the left side of the neck was shaved and prepped with DuraPrep.  Drapes were applied.  An italic S type of incision was made from the anterior frontal area to the posterior parietal area.  Raney clips were applied to the edges and retraction of the scalp was achieved.  Then, 2 burr holes were made, one anterior and one posterior. Immediately chronic blood came through the wound at high-pressure.  We completed the craniotomy.  The dura mater was attached to the skull. Immediately using irrigation in all 4  quadrants, we were able to remove not only the chronic but also the acute subdural hematoma, which was mostly posteriorly secondary to the gravity.  At the end, the brain was pulsatile.  Hemostasis was done using surgery foam.  Once we identified good hemostasis, the area was irrigated 1 more time, and a large drain was left in the posterior aspect of the skull.  The bone was pulled back in place with the help of plates and mini screws.  Then, the scalp was closed with 2-0 Vicryl, and the skin was closed with staples posteriorly and 4-0 nylon anteriorly.  The patient did well and she is going back to the intensive care unit.          ______________________________ Hilda Lias, M.D.    EB/MEDQ  D:  06/09/2012  T:  06/10/2012  Job:  [id number]

## 2012-06-11 MED ORDER — SODIUM CHLORIDE 0.9 % IV BOLUS (SEPSIS)
500.0000 mL | Freq: Once | INTRAVENOUS | Status: AC
  Administered 2012-06-11: 500 mL via INTRAVENOUS

## 2012-06-11 NOTE — Progress Notes (Signed)
UR completed. Devere Brem RNBSN 

## 2012-06-11 NOTE — Progress Notes (Signed)
eLink Physician-Brief Progress Note Patient Name: Jenna Booth DOB: 07-09-1945 MRN: [id number]  Date of Service  06/11/2012   HPI/Events of Note  sbp 101 but can be 80s. bvcurrent hr 60s ablt e to get around with assist per RN  eICU Interventions  500 cc fluid bolus for soft bp   Intervention Category Intermediate Interventions: Hypotension - evaluation and management  Brandolyn Shortridge 06/11/2012, 4:16 AM

## 2012-06-11 NOTE — Progress Notes (Signed)
This admission was a lateral transfer from [facility] emergency department . The H&P .was done by them . Mine was a consult preop . Both of them are in the chart

## 2012-06-11 NOTE — Progress Notes (Signed)
Patient ID: Jenna Booth, female   DOB: [birth year], 67 y.o.   MRN: [id number] Stable, stronger in right leg. Draining working well. To get a ct head in am

## 2012-06-12 ENCOUNTER — Inpatient Hospital Stay (HOSPITAL_COMMUNITY): Payer: Medicare Other

## 2012-06-12 NOTE — Progress Notes (Signed)
Patient ID: Jenna Booth, female   DOB: [birth year], 67 y.o.   MRN: [id number] Seen this am. Neuro stable, no more weakness. Ct head show no more sdh. Plan, to let the drainto gravity, repeat ct in am and then decide about removing it

## 2012-06-13 ENCOUNTER — Inpatient Hospital Stay (HOSPITAL_COMMUNITY): Payer: Medicare Other

## 2012-06-13 MED ORDER — ADULT MULTIVITAMIN W/MINERALS CH
1.0000 | ORAL_TABLET | Freq: Every day | ORAL | Status: DC
  Administered 2012-06-13: 1 via ORAL
  Filled 2012-06-13 (×2): qty 1

## 2012-06-13 MED ORDER — PANTOPRAZOLE SODIUM 40 MG PO TBEC
40.0000 mg | DELAYED_RELEASE_TABLET | Freq: Every day | ORAL | Status: DC
  Administered 2012-06-13 – 2012-06-14 (×2): 40 mg via ORAL
  Filled 2012-06-13 (×2): qty 1

## 2012-06-13 NOTE — Progress Notes (Signed)
Patient arrived to floor via wheelchair from Unit 3100.  Patient wanting to sit in bedside chair; nurse requesting patient to sit in bed until vitals taken and assessment completed. Patient aggreeable.  Patient able to transfer to bed with SBA from wheelchair.  Oriented patient to room and staff.  Call light placed in lap and bed alarmed for safety after explaining use of bed alarms as reminders for safety on this unit.

## 2012-06-13 NOTE — Progress Notes (Addendum)
Subjective: Patient reports no c/o  Objective: Vital signs in last 24 hours: Temp:  [97.4 F (36.3 C)-98.6 F (37 C)] 98 F (36.7 C) (05/10 0354) Pulse Rate:  [51-88] 51 (05/10 0600) Resp:  [12-23] 12 (05/10 0600) BP: (103-144)/(54-79) 126/58 mmHg (05/10 0600) SpO2:  [95 %-100 %] 98 % (05/10 0600)  Intake/Output from previous day: 05/09 0701 - 05/10 0700 In: 240 [P.O.:240] Out: 1695 [Urine:1600; Drains:95] Intake/Output this shift: Total I/O In: -  Out: 300 [Urine:300]  AAOx3  - up in chair   - moves all well  Lab Results: No results found for this basename: WBC, HGB, HCT, PLT,  in the last 72 hours BMET No results found for this basename: NA, K, CL, CO2, GLUCOSE, BUN, CREATININE, CALCIUM,  in the last 72 hours  Studies/Results: Ct Head Wo Contrast  06/12/2012  *RADIOLOGY REPORT*  Clinical Data:   Evaluate subdural hematoma following drainage.  CT HEAD WITHOUT CONTRAST  Technique:  Contiguous axial images were obtained from the base of the skull through the vertex without contrast.  Comparison: Preoperative scan 06/09/2012.  Findings:  There has been left frontoparietal craniotomy for drainage of acute and chronic subdural hematoma.  A surgical drain is in place from a left frontal burr hole.  The subdural hematoma is markedly improved.  Residual extra-axial fluid is less than a centimeter thickness, and largely related to the volume of pneumocephalus and drain which is present.  Minimal residual fluid collection near the vertex approximately 7 mm thickness.  Left to right midline shift is improved, now 4 mm as compared with 11 mm previous.  No impending herniation.  No intraparenchymal bleed.  No new findings.  IMPRESSION: Satisfactory appearance status post drainage of left subdural hematoma.   Original Report Authenticated By: Davonna Belling, M.D.    Drain  - 20 cc/24 hours Assessment/Plan: Waiting CT today  - hopefully will remove drain  - increase activity  Transfer to floor soon    LOS: 4 days   CT looks good  - jp drain removed  Ender Rorke R, MD 06/13/2012, 7:41 AM

## 2012-06-14 MED ORDER — HYDROCODONE-ACETAMINOPHEN 5-325 MG PO TABS: 1.0000 | ORAL_TABLET | Freq: Four times a day (QID) | ORAL | Status: DC | PRN

## 2012-06-14 NOTE — Discharge Summary (Signed)
Physician Discharge Summary  Patient ID: Jenna Booth MRN: [id number] DOB/AGE: 07-04-1945 67 y.o.  Admit date: 06/09/2012 Discharge date: 06/14/2012  Admission Diagnoses:SDH  Discharge Diagnoses: SDH Active Problems:   * No active hospital problems. *   Discharged Condition: good  Hospital Course: pt went to er with HA and weakness  - SDH found  - pt transferred to [facility] and underwent surgery for SDH.  Pt has been doing well, ambulating, eating  Consults: None  Significant Diagnostic Studies: none  Treatments: surgery:Left frontoparietal craniotomy, evacuation of chronic and  acute subdural hematoma.   Discharge Exam: Blood pressure 128/50, pulse 62, temperature 98.1 F (36.7 C), temperature source Oral, resp. rate 18, height 5\' 3"  (1.6 m), weight 88.9 kg (195 lb 15.8 oz), SpO2 98.00%. Wound:c/d/i  Disposition: home     Medication List    STOP taking these medications       aspirin EC 81 MG tablet     fish oil-omega-3 fatty acids 1000 MG capsule      TAKE these medications       HYDROcodone-acetaminophen 5-325 MG per tablet  Commonly known as:  NORCO/VICODIN  Take 1-2 tablets by mouth every 6 (six) hours as needed.     multivitamin with minerals Tabs  Take 1 tablet by mouth daily.     omeprazole 10 MG capsule  Commonly known as:  PRILOSEC  Take 10 mg by mouth daily.     PROBIOTIC DAILY PO  Take 1 tablet by mouth daily.         SignedClydene Fake, MD 06/14/2012, 9:38 AM

## 2012-06-14 NOTE — Plan of Care (Signed)
D/c instructions given to pt. Pt is alert and oriented x 4. Med script given to pt. Instructed pt how to take med, v/u. Instructed pt what meds to cont and what meds to stop, v/u. No iv acess. Pt is stable to be d/c.

## 2012-07-14 ENCOUNTER — Other Ambulatory Visit (HOSPITAL_COMMUNITY): Payer: Self-pay | Admitting: Internal Medicine

## 2012-07-14 DIAGNOSIS — Z139 Encounter for screening, unspecified: Secondary | ICD-10-CM

## 2012-07-15 ENCOUNTER — Other Ambulatory Visit (HOSPITAL_COMMUNITY): Payer: Self-pay | Admitting: Neurosurgery

## 2012-07-15 DIAGNOSIS — D496 Neoplasm of unspecified behavior of brain: Secondary | ICD-10-CM

## 2012-07-17 ENCOUNTER — Ambulatory Visit (HOSPITAL_COMMUNITY)
Admission: RE | Admit: 2012-07-17 | Discharge: 2012-07-17 | Disposition: A | Discharge status: Home / Self Care | Payer: Medicare Other | Source: Ambulatory Visit | Attending: Neurosurgery | Admitting: Neurosurgery

## 2012-07-17 ENCOUNTER — Ambulatory Visit (HOSPITAL_COMMUNITY): Payer: Medicare Other

## 2012-07-17 DIAGNOSIS — D496 Neoplasm of unspecified behavior of brain: Secondary | ICD-10-CM

## 2012-07-17 DIAGNOSIS — I6200 Nontraumatic subdural hemorrhage, unspecified: Secondary | ICD-10-CM | POA: Insufficient documentation

## 2012-07-17 DIAGNOSIS — Z09 Encounter for follow-up examination after completed treatment for conditions other than malignant neoplasm: Secondary | ICD-10-CM | POA: Insufficient documentation

## 2012-07-23 ENCOUNTER — Ambulatory Visit (HOSPITAL_COMMUNITY)
Admission: RE | Admit: 2012-07-23 | Discharge: 2012-07-23 | Disposition: A | Discharge status: Home / Self Care | Payer: Medicare Other | Source: Ambulatory Visit | Attending: Internal Medicine | Admitting: Internal Medicine

## 2012-07-23 DIAGNOSIS — Z139 Encounter for screening, unspecified: Secondary | ICD-10-CM

## 2012-07-23 DIAGNOSIS — Z1231 Encounter for screening mammogram for malignant neoplasm of breast: Secondary | ICD-10-CM | POA: Insufficient documentation

## 2012-10-01 ENCOUNTER — Encounter (HOSPITAL_COMMUNITY): Payer: Self-pay | Admitting: Pharmacy Technician

## 2012-10-07 ENCOUNTER — Encounter (HOSPITAL_COMMUNITY)
Admission: RE | Admit: 2012-10-07 | Discharge: 2012-10-07 | Disposition: A | Discharge status: Home / Self Care | Payer: Medicare Other | Source: Ambulatory Visit | Attending: Ophthalmology | Admitting: Ophthalmology

## 2012-10-07 ENCOUNTER — Encounter (HOSPITAL_COMMUNITY): Payer: Self-pay

## 2012-10-09 MED ORDER — NEOMYCIN-POLYMYXIN-DEXAMETH 3.5-10000-0.1 OP OINT
TOPICAL_OINTMENT | OPHTHALMIC | Status: AC
  Filled 2012-10-09: qty 3.5

## 2012-10-09 MED ORDER — CYCLOPENTOLATE-PHENYLEPHRINE OP SOLN OPTIME - NO CHARGE
OPHTHALMIC | Status: AC
  Filled 2012-10-09: qty 2

## 2012-10-09 MED ORDER — LIDOCAINE HCL (PF) 1 % IJ SOLN
INTRAMUSCULAR | Status: AC
  Filled 2012-10-09: qty 2

## 2012-10-09 MED ORDER — LIDOCAINE HCL 3.5 % OP GEL
OPHTHALMIC | Status: AC
  Filled 2012-10-09: qty 5

## 2012-10-09 MED ORDER — TETRACAINE HCL 0.5 % OP SOLN
OPHTHALMIC | Status: AC
  Filled 2012-10-09: qty 2

## 2012-10-12 ENCOUNTER — Ambulatory Visit (HOSPITAL_COMMUNITY)
Admission: RE | Admit: 2012-10-12 | Discharge: 2012-10-12 | Disposition: A | Discharge status: Home / Self Care | Payer: Medicare Other | Source: Ambulatory Visit | Attending: Ophthalmology | Admitting: Ophthalmology

## 2012-10-12 ENCOUNTER — Encounter (HOSPITAL_COMMUNITY): Payer: Self-pay | Admitting: Anesthesiology

## 2012-10-12 ENCOUNTER — Ambulatory Visit (HOSPITAL_COMMUNITY): Payer: Medicare Other | Admitting: Anesthesiology

## 2012-10-12 ENCOUNTER — Encounter (HOSPITAL_COMMUNITY): Payer: Self-pay | Admitting: *Deleted

## 2012-10-12 ENCOUNTER — Encounter (HOSPITAL_COMMUNITY)
Admission: RE | Disposition: A | Discharge status: Home / Self Care | Payer: Self-pay | Source: Ambulatory Visit | Attending: Ophthalmology

## 2012-10-12 DIAGNOSIS — H2510 Age-related nuclear cataract, unspecified eye: Secondary | ICD-10-CM | POA: Insufficient documentation

## 2012-10-12 HISTORY — PX: CATARACT EXTRACTION W/PHACO: SHX586

## 2012-10-12 SURGERY — PHACOEMULSIFICATION, CATARACT, WITH IOL INSERTION
Anesthesia: Monitor Anesthesia Care | Site: Eye | Laterality: Left | Wound class: Clean

## 2012-10-12 MED ORDER — MIDAZOLAM HCL 2 MG/2ML IJ SOLN
1.0000 mg | INTRAMUSCULAR | Status: DC | PRN
  Administered 2012-10-12: 2 mg via INTRAVENOUS

## 2012-10-12 MED ORDER — PHENYLEPHRINE HCL 2.5 % OP SOLN
OPHTHALMIC | Status: AC
  Filled 2012-10-12: qty 15

## 2012-10-12 MED ORDER — ONDANSETRON HCL 4 MG/2ML IJ SOLN: 4.0000 mg | Freq: Once | INTRAMUSCULAR | Status: DC | PRN

## 2012-10-12 MED ORDER — ONDANSETRON HCL 4 MG/2ML IJ SOLN
INTRAMUSCULAR | Status: DC | PRN
  Administered 2012-10-12: 4 mg via INTRAVENOUS

## 2012-10-12 MED ORDER — BSS IO SOLN
INTRAOCULAR | Status: DC | PRN
  Administered 2012-10-12: 15 mL via INTRAOCULAR

## 2012-10-12 MED ORDER — MIDAZOLAM HCL 2 MG/2ML IJ SOLN
INTRAMUSCULAR | Status: AC
  Filled 2012-10-12: qty 2

## 2012-10-12 MED ORDER — CYCLOPENTOLATE-PHENYLEPHRINE 0.2-1 % OP SOLN
1.0000 [drp] | OPHTHALMIC | Status: AC
  Administered 2012-10-12 (×3): 1 [drp] via OPHTHALMIC

## 2012-10-12 MED ORDER — NEOMYCIN-POLYMYXIN-DEXAMETH 0.1 % OP OINT
TOPICAL_OINTMENT | OPHTHALMIC | Status: DC | PRN
  Administered 2012-10-12: 1 via OPHTHALMIC

## 2012-10-12 MED ORDER — PHENYLEPHRINE HCL 2.5 % OP SOLN
1.0000 [drp] | OPHTHALMIC | Status: AC
  Administered 2012-10-12 (×3): 1 [drp] via OPHTHALMIC

## 2012-10-12 MED ORDER — LIDOCAINE HCL 3.5 % OP GEL
1.0000 "application " | Freq: Once | OPHTHALMIC | Status: AC
  Administered 2012-10-12: 1 via OPHTHALMIC

## 2012-10-12 MED ORDER — LIDOCAINE HCL (PF) 1 % IJ SOLN
INTRAMUSCULAR | Status: DC | PRN
  Administered 2012-10-12: .5 mL

## 2012-10-12 MED ORDER — PROVISC 10 MG/ML IO SOLN
INTRAOCULAR | Status: DC | PRN
  Administered 2012-10-12: 8.5 mg via INTRAOCULAR

## 2012-10-12 MED ORDER — EPINEPHRINE HCL 1 MG/ML IJ SOLN
INTRAMUSCULAR | Status: AC
  Filled 2012-10-12: qty 1

## 2012-10-12 MED ORDER — FENTANYL CITRATE 0.05 MG/ML IJ SOLN
INTRAMUSCULAR | Status: AC
  Filled 2012-10-12: qty 2

## 2012-10-12 MED ORDER — FENTANYL CITRATE 0.05 MG/ML IJ SOLN
25.0000 ug | INTRAMUSCULAR | Status: AC
  Administered 2012-10-12 (×2): 25 ug via INTRAVENOUS

## 2012-10-12 MED ORDER — FENTANYL CITRATE 0.05 MG/ML IJ SOLN: 25.0000 ug | INTRAMUSCULAR | Status: DC | PRN

## 2012-10-12 MED ORDER — TETRACAINE HCL 0.5 % OP SOLN
1.0000 [drp] | OPHTHALMIC | Status: AC
  Administered 2012-10-12 (×3): 1 [drp] via OPHTHALMIC

## 2012-10-12 MED ORDER — POVIDONE-IODINE 5 % OP SOLN
OPHTHALMIC | Status: DC | PRN
  Administered 2012-10-12: 1 via OPHTHALMIC

## 2012-10-12 MED ORDER — EPINEPHRINE HCL 1 MG/ML IJ SOLN
INTRAOCULAR | Status: DC | PRN
  Administered 2012-10-12: 09:00:00

## 2012-10-12 MED ORDER — ONDANSETRON HCL 4 MG/2ML IJ SOLN
INTRAMUSCULAR | Status: AC
  Filled 2012-10-12: qty 2

## 2012-10-12 MED ORDER — LACTATED RINGERS IV SOLN
INTRAVENOUS | Status: DC
  Administered 2012-10-12: 09:00:00 via INTRAVENOUS

## 2012-10-12 SURGICAL SUPPLY — 32 items
CAPSULAR TENSION RING-AMO (OPHTHALMIC RELATED) IMPLANT
CLOTH BEACON ORANGE TIMEOUT ST (SAFETY) ×2 IMPLANT
EYE SHIELD UNIVERSAL CLEAR (GAUZE/BANDAGES/DRESSINGS) ×2 IMPLANT
GLOVE BIO SURGEON STRL SZ 6.5 (GLOVE) IMPLANT
GLOVE BIOGEL PI IND STRL 6.5 (GLOVE) ×1 IMPLANT
GLOVE BIOGEL PI IND STRL 7.0 (GLOVE) ×1 IMPLANT
GLOVE BIOGEL PI IND STRL 7.5 (GLOVE) IMPLANT
GLOVE BIOGEL PI INDICATOR 6.5 (GLOVE) ×1
GLOVE BIOGEL PI INDICATOR 7.0 (GLOVE) ×1
GLOVE BIOGEL PI INDICATOR 7.5 (GLOVE)
GLOVE ECLIPSE 6.5 STRL STRAW (GLOVE) IMPLANT
GLOVE ECLIPSE 7.0 STRL STRAW (GLOVE) IMPLANT
GLOVE ECLIPSE 7.5 STRL STRAW (GLOVE) IMPLANT
GLOVE EXAM NITRILE LRG STRL (GLOVE) IMPLANT
GLOVE EXAM NITRILE MD LF STRL (GLOVE) IMPLANT
GLOVE SKINSENSE NS SZ6.5 (GLOVE)
GLOVE SKINSENSE NS SZ7.0 (GLOVE)
GLOVE SKINSENSE STRL SZ6.5 (GLOVE) IMPLANT
GLOVE SKINSENSE STRL SZ7.0 (GLOVE) IMPLANT
KIT VITRECTOMY (OPHTHALMIC RELATED) IMPLANT
PAD ARMBOARD 7.5X6 YLW CONV (MISCELLANEOUS) ×2 IMPLANT
PROC W NO LENS (INTRAOCULAR LENS)
PROC W SPEC LENS (INTRAOCULAR LENS)
PROCESS W NO LENS (INTRAOCULAR LENS) IMPLANT
PROCESS W SPEC LENS (INTRAOCULAR LENS) IMPLANT
RING MALYGIN (MISCELLANEOUS) IMPLANT
SIGHTPATH CAT PROC W REG LENS (Ophthalmic Related) ×2 IMPLANT
SYR TB 1ML LL NO SAFETY (SYRINGE) ×2 IMPLANT
TAPE SURG TRANSPARENT 2IN (GAUZE/BANDAGES/DRESSINGS) ×1 IMPLANT
TAPE TRANSPARENT 2IN (GAUZE/BANDAGES/DRESSINGS) ×1
VISCOELASTIC ADDITIONAL (OPHTHALMIC RELATED) IMPLANT
WATER STERILE IRR 250ML POUR (IV SOLUTION) ×2 IMPLANT

## 2012-10-12 NOTE — Anesthesia Postprocedure Evaluation (Signed)
  Anesthesia Post-op Note  Patient: Jenna Booth  Procedure(s) Performed: Procedure(s) with comments: CATARACT EXTRACTION PHACO AND INTRAOCULAR LENS PLACEMENT (IOC) (Left) - CDE:16.97  Patient Location: PACU  Anesthesia Type:MAC  Level of Consciousness: awake, alert  and oriented  Airway and Oxygen Therapy: Patient Spontanous Breathing  Post-op Pain: none  Post-op Assessment: Post-op Vital signs reviewed, Patient's Cardiovascular Status Stable, Respiratory Function Stable, Patent Airway and No signs of Nausea or vomiting  Post-op Vital Signs: Reviewed and stable  Complications: No apparent anesthesia complications

## 2012-10-12 NOTE — Anesthesia Preprocedure Evaluation (Addendum)
Anesthesia Evaluation  Patient identified by MRN, date of birth, ID band Patient awake    Reviewed: Allergy & Precautions, H&P , Patient's Chart, lab work & pertinent test results  History of Anesthesia Complications Negative for: history of anesthetic complications  Airway Mallampati: II      Dental  (+) Teeth Intact   Pulmonary          Cardiovascular negative cardio ROS  Rhythm:Regular Rate:Normal     Neuro/Psych  Headaches, Left subdural  c midline shift on CT. She is awake and oriented at this time.    GI/Hepatic GERD-  ,  Endo/Other    Renal/GU      Musculoskeletal   Abdominal (+) + obese,   Peds  Hematology   Anesthesia Other Findings   Reproductive/Obstetrics                           Anesthesia Physical Anesthesia Plan  ASA: II  Anesthesia Plan: MAC   Post-op Pain Management:    Induction: Intravenous  Airway Management Planned: Nasal Cannula  Additional Equipment:   Intra-op Plan:   Post-operative Plan:   Informed Consent: I have reviewed the patients History and Physical, chart, labs and discussed the procedure including the risks, benefits and alternatives for the proposed anesthesia with the patient or authorized representative who has indicated his/her understanding and acceptance.     Plan Discussed with:   Anesthesia Plan Comments:         Anesthesia Quick Evaluation

## 2012-10-12 NOTE — OR Nursing (Signed)
Pt. Stated  She has been seeing a flash in the left eye here recently. States it only happens  1 or 2 times. She is wondering if it is from her cataract. Reported this to doctor  Hunt.

## 2012-10-12 NOTE — Op Note (Signed)
Date of Admission: 10/12/2012  Date of Surgery: 10/12/2012  Pre-Op Dx: Cataract  Left  Eye  Post-Op Dx: Cataract  Left  Eye,  Dx Code 366.16  Surgeon: Gemma Payor, M.D.  Assistants: None  Anesthesia: Topical with MAC  Indications: Painless, progressive loss of vision with compromise of daily activities.  Surgery: Cataract Extraction with Intraocular lens Implant Left Eye  Discription: The patient had dilating drops and viscous lidocaine placed into the left eye in the pre-op holding area. After transfer to the operating room, a time out was performed. The patient was then prepped and draped. Beginning with a 75 degree blade a paracentesis port was made at the surgeon's 2 o'clock position. The anterior chamber was then filled with 1% non-preserved lidocaine. This was followed by filling the anterior chamber with Provisc. A 2.21mm keratome blade was used to make a clear corneal incision at the temporal limbus. A bent cystatome needle was used to create a continuous tear capsulotomy. Hydrodissection was performed with balanced salt solution on a Fine canula. The lens nucleus was then removed using the phacoemulsification handpiece. Residual cortex was removed with the I&A handpiece. The anterior chamber and capsular bag were refilled with Provisc. A posterior chamber intraocular lens was placed into the capsular bag with it's injector. The implant was positioned with the Kuglan hook. The Provisc was then removed from the anterior chamber and capsular bag with the I&A handpiece. Stromal hydration of the main incision and paracentesis port was performed with BSS on a Fine canula. The wounds were tested for leak which was negative. The patient tolerated the procedure well. There were no operative complications. The patient was then transferred to the recovery room in stable condition.  Complications: None  Specimen: None  EBL: None  Prosthetic device: B&L enVista, MX60, power 15.5D, SN [id number].

## 2012-10-12 NOTE — H&P (Signed)
I have reviewed the H&P, the patient was re-examined, and I have identified no interval changes in medical condition and plan of care since the history and physical of record  

## 2012-10-12 NOTE — Transfer of Care (Signed)
Immediate Anesthesia Transfer of Care Note  Patient: Jenna Booth  Procedure(s) Performed: Procedure(s) with comments: CATARACT EXTRACTION PHACO AND INTRAOCULAR LENS PLACEMENT (IOC) (Left) - CDE:16.97  Patient Location: PACU and Short Stay  Anesthesia Type:MAC  Level of Consciousness: awake  Airway & Oxygen Therapy: Patient Spontanous Breathing  Post-op Assessment: Report given to PACU RN  Post vital signs: Reviewed  Complications: No apparent anesthesia complications

## 2012-10-13 ENCOUNTER — Encounter (HOSPITAL_COMMUNITY): Payer: Self-pay | Admitting: Ophthalmology

## 2014-12-05 DIAGNOSIS — Z23 Encounter for immunization: Secondary | ICD-10-CM | POA: Diagnosis not present

## 2015-03-13 DIAGNOSIS — I10 Essential (primary) hypertension: Secondary | ICD-10-CM | POA: Diagnosis not present

## 2015-03-13 DIAGNOSIS — E782 Mixed hyperlipidemia: Secondary | ICD-10-CM | POA: Diagnosis not present

## 2015-03-13 DIAGNOSIS — R7301 Impaired fasting glucose: Secondary | ICD-10-CM | POA: Diagnosis not present

## 2015-03-15 DIAGNOSIS — E6609 Other obesity due to excess calories: Secondary | ICD-10-CM | POA: Diagnosis not present

## 2015-03-15 DIAGNOSIS — W5501XA Bitten by cat, initial encounter: Secondary | ICD-10-CM | POA: Diagnosis not present

## 2015-03-15 DIAGNOSIS — E782 Mixed hyperlipidemia: Secondary | ICD-10-CM | POA: Diagnosis not present

## 2015-03-15 DIAGNOSIS — K219 Gastro-esophageal reflux disease without esophagitis: Secondary | ICD-10-CM | POA: Diagnosis not present

## 2015-03-15 DIAGNOSIS — R7301 Impaired fasting glucose: Secondary | ICD-10-CM | POA: Diagnosis not present

## 2015-03-15 DIAGNOSIS — M25569 Pain in unspecified knee: Secondary | ICD-10-CM | POA: Diagnosis not present

## 2015-03-15 DIAGNOSIS — R69 Illness, unspecified: Secondary | ICD-10-CM | POA: Diagnosis not present

## 2015-03-15 DIAGNOSIS — I10 Essential (primary) hypertension: Secondary | ICD-10-CM | POA: Diagnosis not present

## 2015-04-11 IMAGING — CT CT HEAD W/O CM
1 series · 16 of 30 positions shown, 20 images · non-contrast
Comparison: None

CLINICAL DATA: 66-year-old female with dizzy mass, altered mental
status, left-sided headache and right-sided weakness.

CT HEAD WITHOUT CONTRAST
TECHNIQUE: Contiguous axial images were obtained from the base of
the skull through the vertex without contrast.

[Series 2: headseq 4.8 h37s · axial · 0.43mm/px · z∈[+79,+221]mm · 16 of 30 slices shown, 20 images]
[im 2/30  brain]
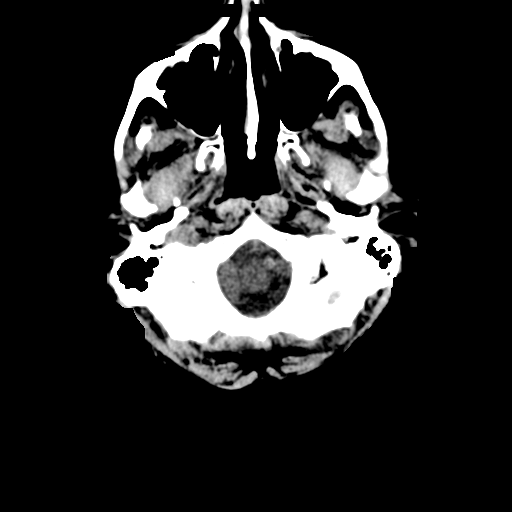
[im 2/30  bone]
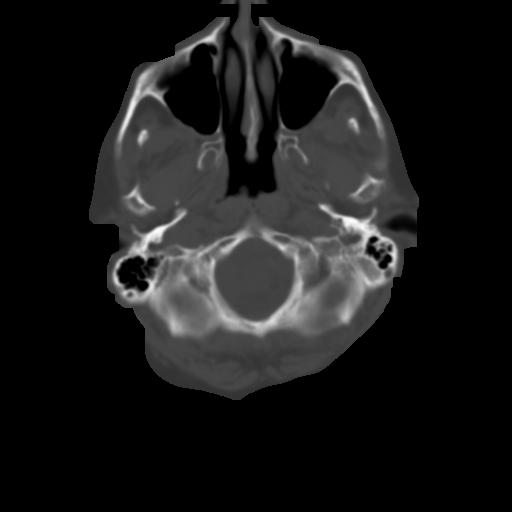
[im 4/30  brain]
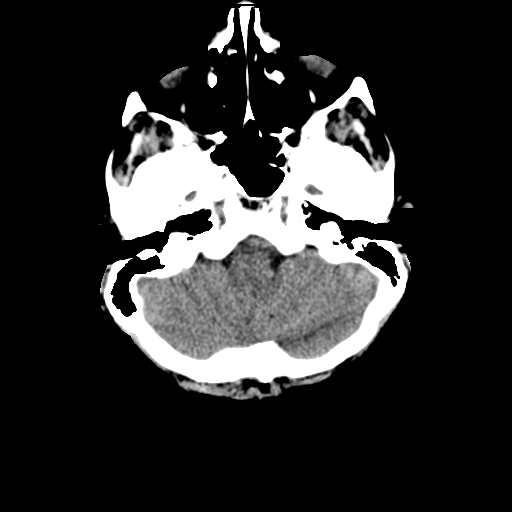
[im 6/30  brain]
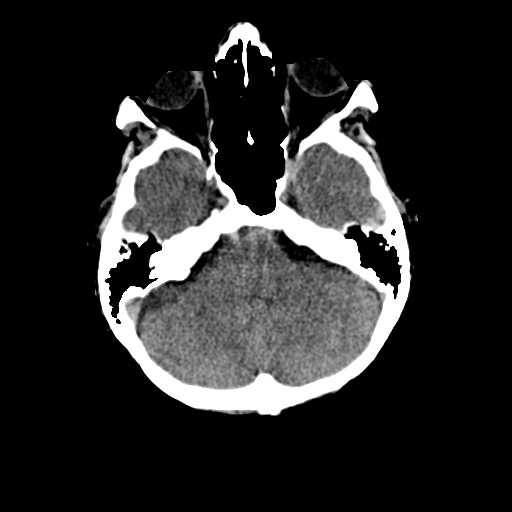
[im 8/30  brain]
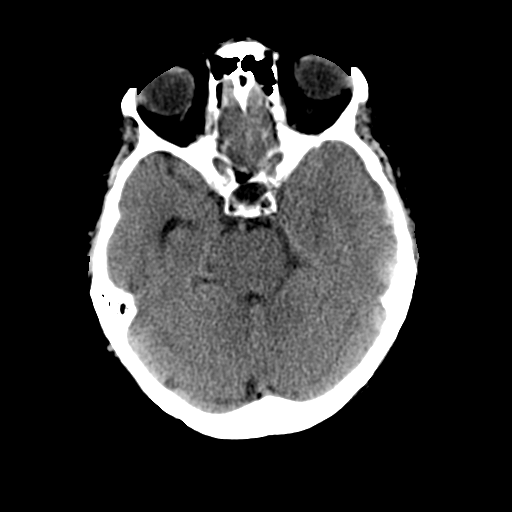
[im 9/30  brain]
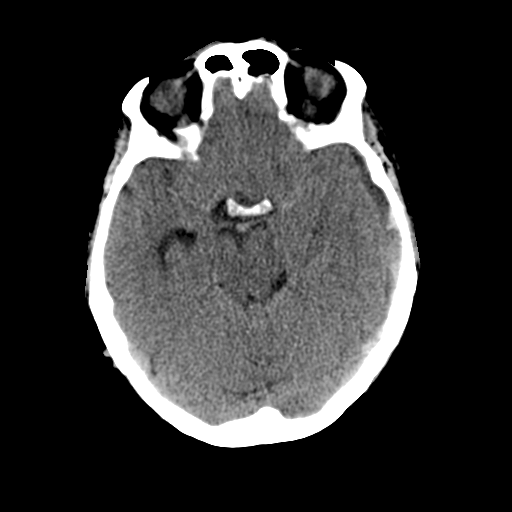
[im 9/30  bone]
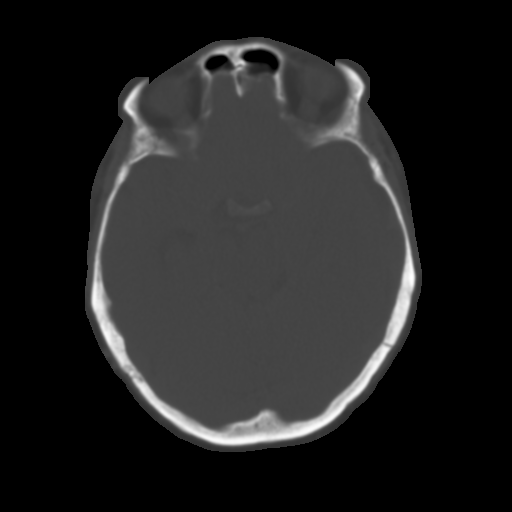
[im 11/30  brain]
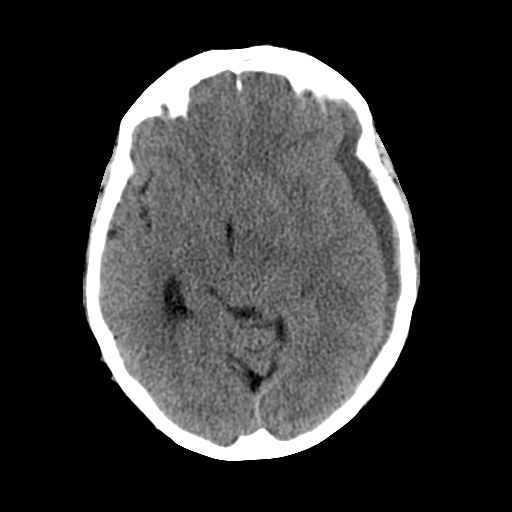
[im 13/30  brain]
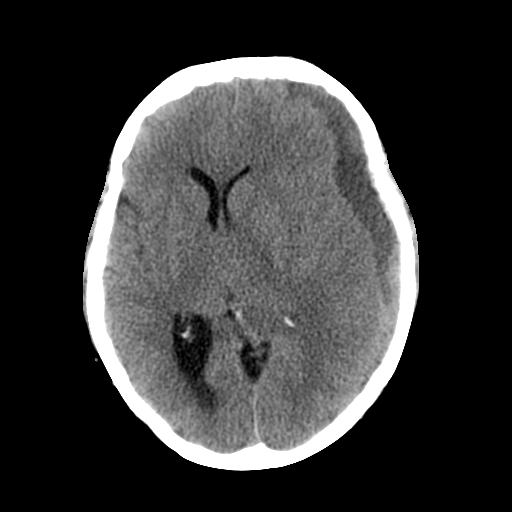
[im 15/30  brain]
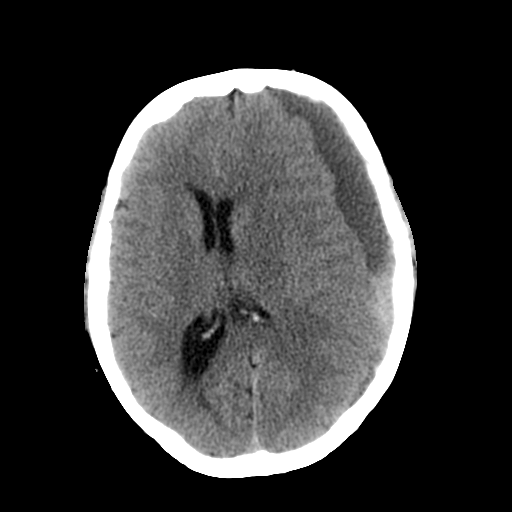
[im 16/30  brain]
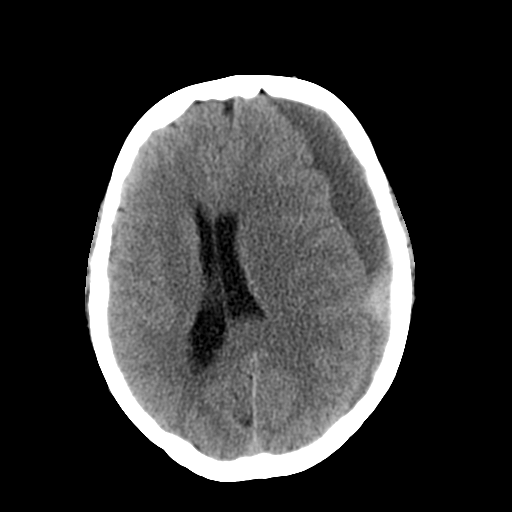
[im 16/30  bone]
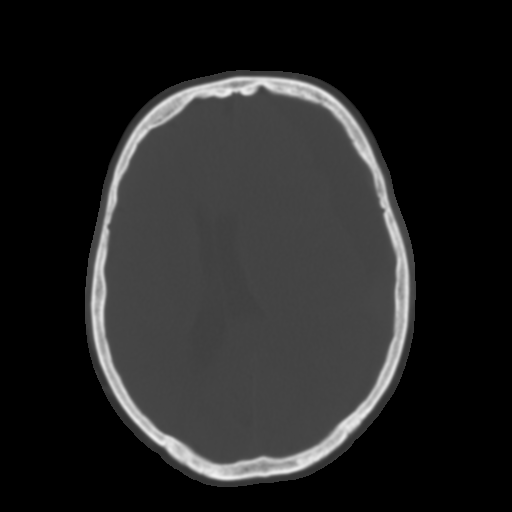
[im 18/30  brain]
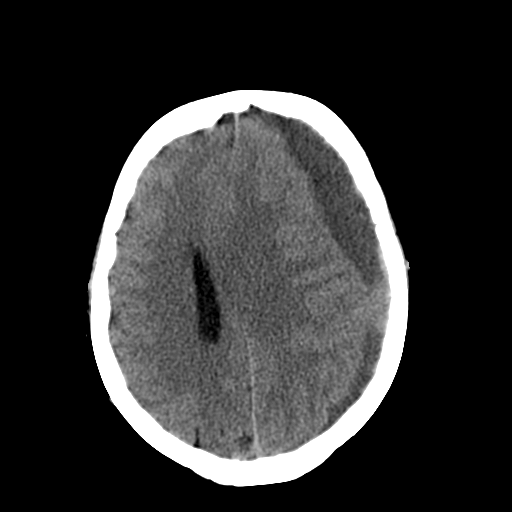
[im 20/30  brain]
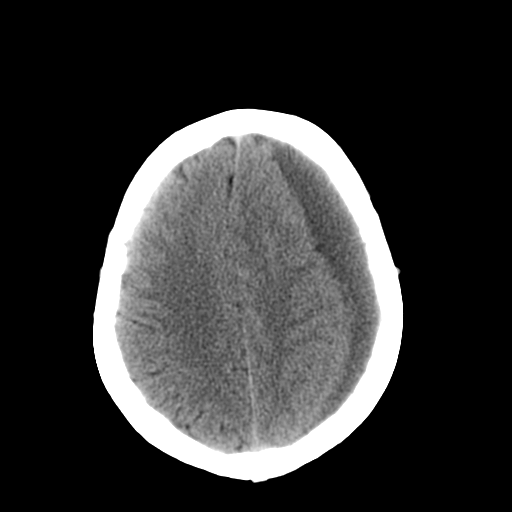
[im 22/30  brain]
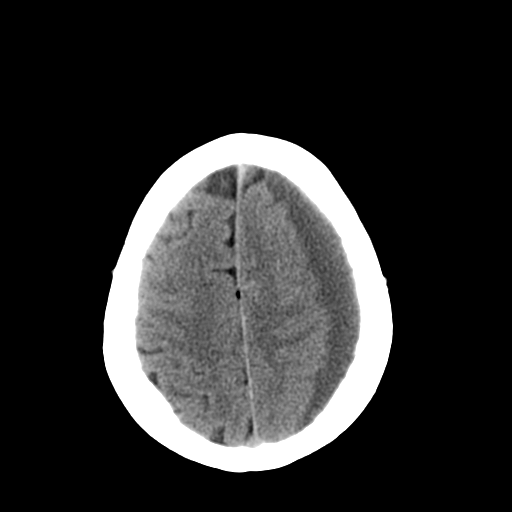
[im 23/30  brain]
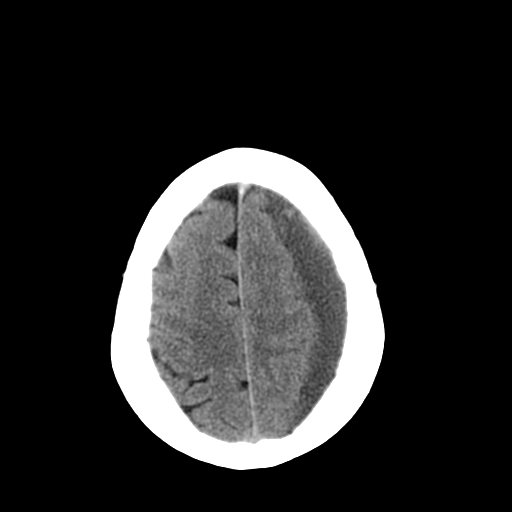
[im 23/30  bone]
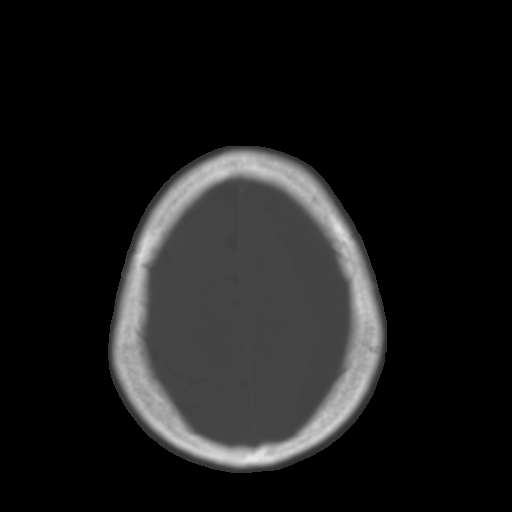
[im 25/30  brain]
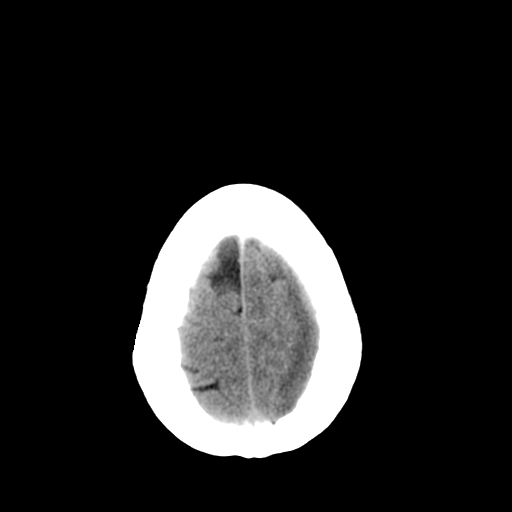
[im 27/30  brain]
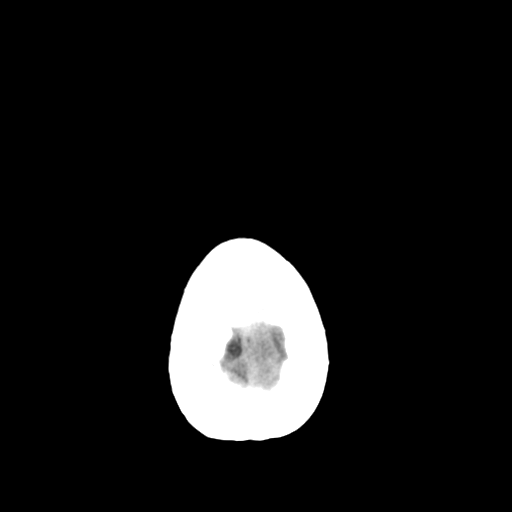
[im 29/30  brain]
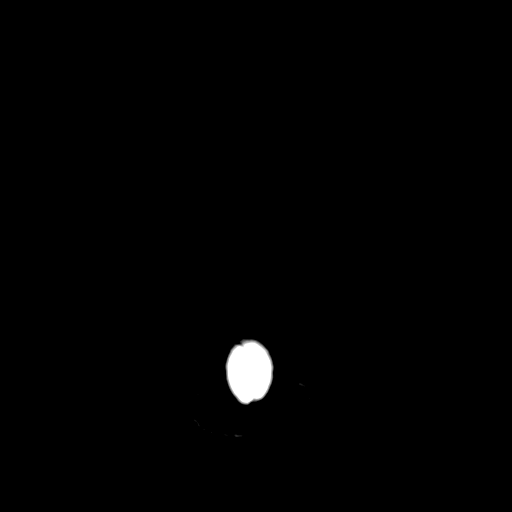

[16 of 30 positions shown; findings below may reference images not displayed]

FINDINGS: A left subdural collection measuring 1.8 cm is noted of
varying densities with acute to subacute blood noted posteriorly.
This subdural collection causes 1 cm left to right midline shift
and right lateral ventriculomegaly.
There is no evidence of mass lesion, acute infarction or
intraparenchymal hemorrhage.

No acute or suspicious bony abnormalities are identified.
IMPRESSION: 1.8 cm mixed age left subdural hematoma with acute to subacute
blood noted posteriorly.  This subdural hematoma causes 1 cm left
to right midline shift and right lateral ventriculomegaly.

Critical Value/emergent results were called by telephone at the
time of interpretation on 06/09/2012 at [DATE] a.m. to Dr. Maiellaro,
who verbally acknowledged these results.

## 2015-04-11 IMAGING — CR DG CHEST 1V PORT
1 series · 1 of 1 positions shown · non-contrast
Comparison: None

CLINICAL DATA: Weakness, headache and left leg weakness.

PORTABLE CHEST - 1 VIEW

[view not recorded]
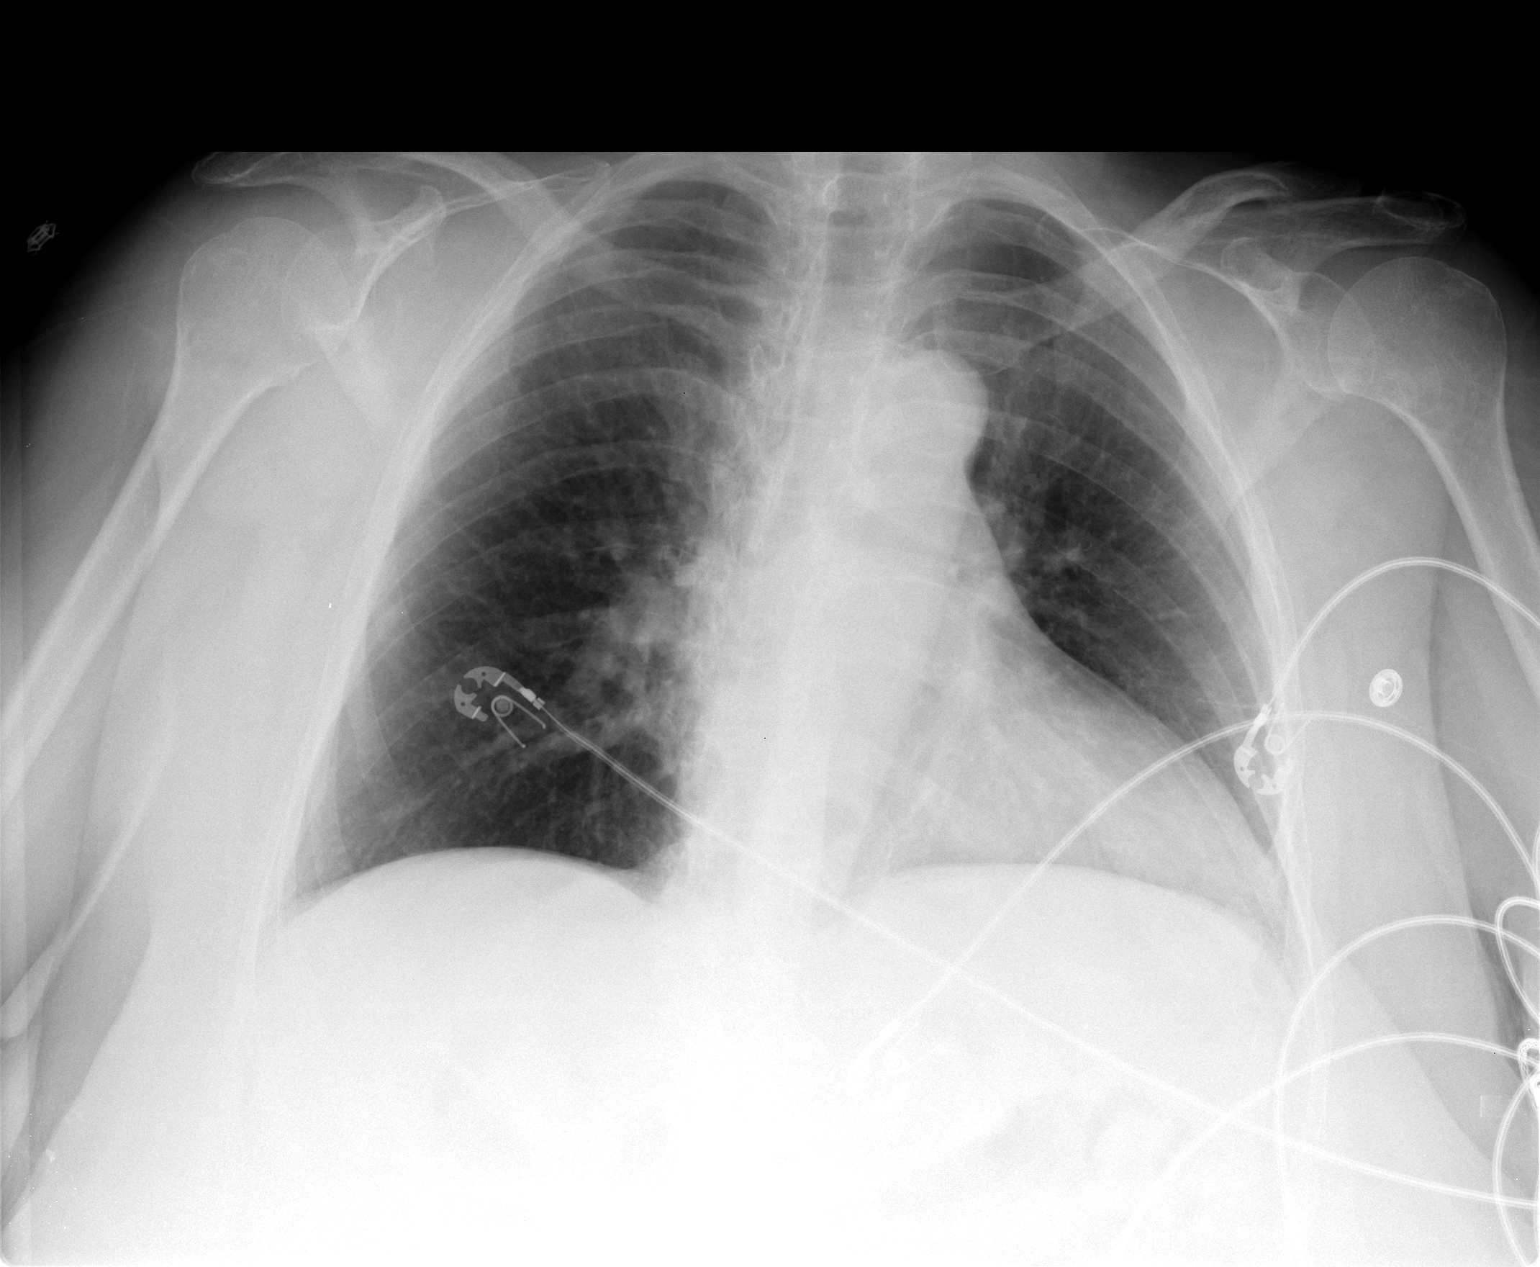

[1 of 1 positions shown; findings below may reference images not displayed]

FINDINGS: The cardiomediastinal silhouette is unremarkable.
The lungs are clear.
There is no evidence of focal airspace disease, pulmonary edema,
suspicious pulmonary nodule/mass, pleural effusion, or
pneumothorax.
No acute bony abnormalities are identified.
IMPRESSION: No evidence of active cardiopulmonary disease.

## 2015-06-07 DIAGNOSIS — M25561 Pain in right knee: Secondary | ICD-10-CM | POA: Diagnosis not present

## 2015-06-07 DIAGNOSIS — M25562 Pain in left knee: Secondary | ICD-10-CM | POA: Diagnosis not present

## 2015-07-26 ENCOUNTER — Ambulatory Visit (INDEPENDENT_AMBULATORY_CARE_PROVIDER_SITE_OTHER): Payer: Medicare HMO

## 2015-07-26 ENCOUNTER — Encounter: Payer: Self-pay | Admitting: Orthopedic Surgery

## 2015-07-26 ENCOUNTER — Ambulatory Visit (INDEPENDENT_AMBULATORY_CARE_PROVIDER_SITE_OTHER): Payer: Medicare HMO | Admitting: Orthopedic Surgery

## 2015-07-26 VITALS — BP 139/87 | HR 79 | Temp 98.1°F | Ht 59.0 in | Wt 159.0 lb

## 2015-07-26 DIAGNOSIS — M25562 Pain in left knee: Secondary | ICD-10-CM

## 2015-07-26 NOTE — Progress Notes (Signed)
Chief Complaint  Patient presents with  . New Patient (Initial Visit)    Bilateral knee pain left worse than right   HPI BILATERAL KNEE PAIN X 8 MONTHS  LEFT KNEE GAVE WAY AND SHE SAW THE PA, STARTED MELOXICAM NOW PAIN UNDER CONTROL  PAIN IS CONSTANT BUT MILD ASSOC W SWELLING  CATCHING     Review of Systems  Constitutional: Positive for weight loss.  HENT: Positive for hearing loss and tinnitus.   Eyes: Positive for redness.  Respiratory: Negative.   Gastrointestinal: Positive for constipation.  Genitourinary: Negative.   Musculoskeletal: Positive for back pain and joint pain.  Psychiatric/Behavioral: Positive for depression.    Past Medical History  Diagnosis Date  . Acid reflux     Past Surgical History  Procedure Laterality Date  . Rhinoplasty    . Craniotomy Left 06/09/2012    Procedure: CRANIOTOMY HEMATOMA EVACUATION SUBDURAL;  Surgeon: Floyce Stakes, MD;  Location: [facility];  Service: Neurosurgery;  Laterality: Left;  . Cataract extraction w/phaco Left 10/12/2012    Procedure: CATARACT EXTRACTION PHACO AND INTRAOCULAR LENS PLACEMENT (IOC);  Surgeon: Tonny Branch, MD;  Location: [facility];  Service: Ophthalmology;  Laterality: Left;  CDE:16.97   No family history on file. Social History  Substance Use Topics  . Smoking status: Never Smoker   . Smokeless tobacco: Never Used  . Alcohol Use: No    Current outpatient prescriptions:  .  loratadine (CLARITIN) 10 MG tablet, Take 10 mg by mouth daily., Disp: , Rfl:  .  meloxicam (MOBIC) 7.5 MG tablet, Take 7.5 mg by mouth daily., Disp: , Rfl:  .  Multiple Vitamin (MULTIVITAMIN WITH MINERALS) TABS, Take 1 tablet by mouth daily., Disp: , Rfl:  .  omeprazole (PRILOSEC) 10 MG capsule, Take 10 mg by mouth daily., Disp: , Rfl:  .  polyethylene glycol (MIRALAX / GLYCOLAX) packet, Take 17 g by mouth daily., Disp: , Rfl:  .  Probiotic Product (PROBIOTIC DAILY PO), Take 1 tablet by mouth daily., Disp: , Rfl:  .  Specialty Vitamins  Products (MAGNESIUM, AMINO ACID CHELATE,) 133 MG tablet, Take 1 tablet by mouth daily., Disp: , Rfl:  .  traMADol (ULTRAM) 50 MG tablet, Take 50 mg by mouth as needed., Disp: , Rfl:   BP 139/87 mmHg  Pulse 79  Temp(Src) 98.1 F (36.7 C)  Ht 4\' 11"  (1.499 m)  Wt 159 lb (72.122 kg)  BMI 32.10 kg/m2  Physical Exam  Constitutional: She is oriented to person, place, and time. She appears well-developed and well-nourished. No distress.  Cardiovascular: Normal rate and intact distal pulses.   Neurological: She is alert and oriented to person, place, and time. She has normal reflexes. She exhibits normal muscle tone. Coordination normal.  Skin: Skin is warm and dry. No rash noted. She is not diaphoretic. No erythema. No pallor.  Psychiatric: She has a normal mood and affect. Her behavior is normal. Judgment and thought content normal.    Ortho Exam  STABLE RIGHT AND LEFT KNEE  ROM LEFT KNEE 120 AND RIGHT 125 NO TENDERNESS IN THE RIGHT OR LEFT KNEE  BOTH KNEES WERE STABLE WHEN LIGS TESTED   GAIT NO SUPPORT AND NORMAL    ASSESSMENT: My personal interpretation of the images:  ARTHRITIS LEFT KNEE MODERATE   PLAN F/U AS NEEDED   Arther Abbott, MD 07/26/2015 4:49 PM

## 2015-09-25 DIAGNOSIS — E785 Hyperlipidemia, unspecified: Secondary | ICD-10-CM | POA: Diagnosis not present

## 2015-09-25 DIAGNOSIS — I10 Essential (primary) hypertension: Secondary | ICD-10-CM | POA: Diagnosis not present

## 2015-09-25 DIAGNOSIS — R7301 Impaired fasting glucose: Secondary | ICD-10-CM | POA: Diagnosis not present

## 2015-09-27 DIAGNOSIS — I10 Essential (primary) hypertension: Secondary | ICD-10-CM | POA: Diagnosis not present

## 2015-09-27 DIAGNOSIS — R7301 Impaired fasting glucose: Secondary | ICD-10-CM | POA: Diagnosis not present

## 2015-09-27 DIAGNOSIS — R69 Illness, unspecified: Secondary | ICD-10-CM | POA: Diagnosis not present

## 2015-09-27 DIAGNOSIS — E6609 Other obesity due to excess calories: Secondary | ICD-10-CM | POA: Diagnosis not present

## 2015-09-27 DIAGNOSIS — E782 Mixed hyperlipidemia: Secondary | ICD-10-CM | POA: Diagnosis not present

## 2015-09-27 DIAGNOSIS — M25569 Pain in unspecified knee: Secondary | ICD-10-CM | POA: Diagnosis not present

## 2015-09-27 DIAGNOSIS — K219 Gastro-esophageal reflux disease without esophagitis: Secondary | ICD-10-CM | POA: Diagnosis not present

## 2015-12-05 DIAGNOSIS — Z23 Encounter for immunization: Secondary | ICD-10-CM | POA: Diagnosis not present

## 2016-03-29 DIAGNOSIS — R7301 Impaired fasting glucose: Secondary | ICD-10-CM | POA: Diagnosis not present

## 2016-03-29 DIAGNOSIS — E782 Mixed hyperlipidemia: Secondary | ICD-10-CM | POA: Diagnosis not present

## 2016-04-02 DIAGNOSIS — I10 Essential (primary) hypertension: Secondary | ICD-10-CM | POA: Diagnosis not present

## 2016-04-02 DIAGNOSIS — I7300 Raynaud's syndrome without gangrene: Secondary | ICD-10-CM | POA: Diagnosis not present

## 2016-04-02 DIAGNOSIS — E6609 Other obesity due to excess calories: Secondary | ICD-10-CM | POA: Diagnosis not present

## 2016-04-02 DIAGNOSIS — R69 Illness, unspecified: Secondary | ICD-10-CM | POA: Diagnosis not present

## 2016-04-02 DIAGNOSIS — H9319 Tinnitus, unspecified ear: Secondary | ICD-10-CM | POA: Diagnosis not present

## 2016-04-02 DIAGNOSIS — K219 Gastro-esophageal reflux disease without esophagitis: Secondary | ICD-10-CM | POA: Diagnosis not present

## 2016-04-02 DIAGNOSIS — M25569 Pain in unspecified knee: Secondary | ICD-10-CM | POA: Diagnosis not present

## 2016-04-02 DIAGNOSIS — R7301 Impaired fasting glucose: Secondary | ICD-10-CM | POA: Diagnosis not present

## 2016-04-02 DIAGNOSIS — E782 Mixed hyperlipidemia: Secondary | ICD-10-CM | POA: Diagnosis not present

## 2016-04-05 ENCOUNTER — Other Ambulatory Visit (HOSPITAL_COMMUNITY): Payer: Self-pay | Admitting: Internal Medicine

## 2016-04-05 DIAGNOSIS — R29890 Loss of height: Secondary | ICD-10-CM

## 2016-04-08 ENCOUNTER — Ambulatory Visit (HOSPITAL_COMMUNITY)
Admission: RE | Admit: 2016-04-08 | Discharge: 2016-04-08 | Disposition: A | Discharge status: Home / Self Care | Payer: Medicare HMO | Source: Ambulatory Visit | Attending: Internal Medicine | Admitting: Internal Medicine

## 2016-04-08 DIAGNOSIS — R29890 Loss of height: Secondary | ICD-10-CM

## 2016-04-08 DIAGNOSIS — M810 Age-related osteoporosis without current pathological fracture: Secondary | ICD-10-CM | POA: Diagnosis not present

## 2016-07-29 DIAGNOSIS — R7301 Impaired fasting glucose: Secondary | ICD-10-CM | POA: Diagnosis not present

## 2016-07-29 DIAGNOSIS — E782 Mixed hyperlipidemia: Secondary | ICD-10-CM | POA: Diagnosis not present

## 2016-08-05 DIAGNOSIS — E782 Mixed hyperlipidemia: Secondary | ICD-10-CM | POA: Diagnosis not present

## 2016-08-05 DIAGNOSIS — I10 Essential (primary) hypertension: Secondary | ICD-10-CM | POA: Diagnosis not present

## 2016-08-05 DIAGNOSIS — M25569 Pain in unspecified knee: Secondary | ICD-10-CM | POA: Diagnosis not present

## 2016-08-05 DIAGNOSIS — I7300 Raynaud's syndrome without gangrene: Secondary | ICD-10-CM | POA: Diagnosis not present

## 2016-08-05 DIAGNOSIS — R69 Illness, unspecified: Secondary | ICD-10-CM | POA: Diagnosis not present

## 2016-08-05 DIAGNOSIS — M810 Age-related osteoporosis without current pathological fracture: Secondary | ICD-10-CM | POA: Diagnosis not present

## 2016-08-05 DIAGNOSIS — H9319 Tinnitus, unspecified ear: Secondary | ICD-10-CM | POA: Diagnosis not present

## 2016-08-05 DIAGNOSIS — K219 Gastro-esophageal reflux disease without esophagitis: Secondary | ICD-10-CM | POA: Diagnosis not present

## 2016-08-05 DIAGNOSIS — R7301 Impaired fasting glucose: Secondary | ICD-10-CM | POA: Diagnosis not present

## 2016-12-02 DIAGNOSIS — Z23 Encounter for immunization: Secondary | ICD-10-CM | POA: Diagnosis not present

## 2016-12-11 DIAGNOSIS — H26492 Other secondary cataract, left eye: Secondary | ICD-10-CM | POA: Diagnosis not present

## 2016-12-11 DIAGNOSIS — Z961 Presence of intraocular lens: Secondary | ICD-10-CM | POA: Diagnosis not present

## 2017-01-24 ENCOUNTER — Emergency Department (HOSPITAL_COMMUNITY): Payer: Medicare HMO

## 2017-01-24 ENCOUNTER — Other Ambulatory Visit: Payer: Self-pay

## 2017-01-24 ENCOUNTER — Emergency Department (HOSPITAL_COMMUNITY)
Admission: EM | Admit: 2017-01-24 | Discharge: 2017-01-24 | Disposition: A | Discharge status: Home / Self Care | Payer: Medicare HMO | Attending: Emergency Medicine | Admitting: Emergency Medicine

## 2017-01-24 ENCOUNTER — Encounter (HOSPITAL_COMMUNITY): Payer: Self-pay | Admitting: Emergency Medicine

## 2017-01-24 DIAGNOSIS — Y939 Activity, unspecified: Secondary | ICD-10-CM | POA: Diagnosis not present

## 2017-01-24 DIAGNOSIS — W19XXXA Unspecified fall, initial encounter: Secondary | ICD-10-CM | POA: Insufficient documentation

## 2017-01-24 DIAGNOSIS — Z79899 Other long term (current) drug therapy: Secondary | ICD-10-CM | POA: Insufficient documentation

## 2017-01-24 DIAGNOSIS — Y929 Unspecified place or not applicable: Secondary | ICD-10-CM | POA: Diagnosis not present

## 2017-01-24 DIAGNOSIS — S02401A Maxillary fracture, unspecified, initial encounter for closed fracture: Secondary | ICD-10-CM | POA: Diagnosis not present

## 2017-01-24 DIAGNOSIS — Y999 Unspecified external cause status: Secondary | ICD-10-CM | POA: Diagnosis not present

## 2017-01-24 DIAGNOSIS — S0181XA Laceration without foreign body of other part of head, initial encounter: Secondary | ICD-10-CM | POA: Diagnosis not present

## 2017-01-24 DIAGNOSIS — S0990XA Unspecified injury of head, initial encounter: Secondary | ICD-10-CM | POA: Diagnosis not present

## 2017-01-24 DIAGNOSIS — R55 Syncope and collapse: Secondary | ICD-10-CM | POA: Diagnosis not present

## 2017-01-24 LAB — CBC WITH DIFFERENTIAL/PLATELET
Basophils Absolute: 0 10*3/uL (ref 0.0–0.1)
Basophils Relative: 0 %
Eosinophils Absolute: 0 10*3/uL (ref 0.0–0.7)
Eosinophils Relative: 0 %
HEMATOCRIT: 38.9 % (ref 36.0–46.0)
HEMOGLOBIN: 12.5 g/dL (ref 12.0–15.0)
LYMPHS ABS: 1.3 10*3/uL (ref 0.7–4.0)
LYMPHS PCT: 12 %
MCH: 30.3 pg (ref 26.0–34.0)
MCHC: 32.1 g/dL (ref 30.0–36.0)
MCV: 94.2 fL (ref 78.0–100.0)
MONOS PCT: 7 %
Monocytes Absolute: 0.8 10*3/uL (ref 0.1–1.0)
NEUTROS ABS: 9.1 10*3/uL — AB (ref 1.7–7.7)
NEUTROS PCT: 81 %
Platelets: 172 10*3/uL (ref 150–400)
RBC: 4.13 MIL/uL (ref 3.87–5.11)
RDW: 13.5 % (ref 11.5–15.5)
WBC: 11.2 10*3/uL — ABNORMAL HIGH (ref 4.0–10.5)

## 2017-01-24 LAB — URINALYSIS, ROUTINE W REFLEX MICROSCOPIC
BILIRUBIN URINE: NEGATIVE
Bacteria, UA: NONE SEEN
Glucose, UA: NEGATIVE mg/dL
Ketones, ur: 5 mg/dL — AB
NITRITE: NEGATIVE
PH: 7 (ref 5.0–8.0)
Protein, ur: NEGATIVE mg/dL
SPECIFIC GRAVITY, URINE: 1.009 (ref 1.005–1.030)

## 2017-01-24 LAB — COMPREHENSIVE METABOLIC PANEL
ALK PHOS: 67 U/L (ref 38–126)
ALT: 19 U/L (ref 14–54)
ANION GAP: 13 (ref 5–15)
AST: 28 U/L (ref 15–41)
Albumin: 4.8 g/dL (ref 3.5–5.0)
BILIRUBIN TOTAL: 0.5 mg/dL (ref 0.3–1.2)
BUN: 34 mg/dL — ABNORMAL HIGH (ref 6–20)
CALCIUM: 10.2 mg/dL (ref 8.9–10.3)
CO2: 24 mmol/L (ref 22–32)
CREATININE: 0.91 mg/dL (ref 0.44–1.00)
Chloride: 105 mmol/L (ref 101–111)
GFR calc non Af Amer: >60 mL/min (ref >60)
Glucose, Bld: 190 mg/dL — ABNORMAL HIGH (ref 65–99)
Potassium: 4.1 mmol/L (ref 3.5–5.1)
Sodium: 142 mmol/L (ref 135–145)
TOTAL PROTEIN: 7.4 g/dL (ref 6.5–8.1)

## 2017-01-24 MED ORDER — SODIUM CHLORIDE 0.9 % IV BOLUS (SEPSIS)
1000.0000 mL | Freq: Once | INTRAVENOUS | Status: AC
  Administered 2017-01-24: 1000 mL via INTRAVENOUS

## 2017-01-24 MED ORDER — LIDOCAINE-EPINEPHRINE (PF) 1 %-1:200000 IJ SOLN
10.0000 mL | Freq: Once | INTRAMUSCULAR | Status: AC
  Administered 2017-01-24: 10 mL via INTRADERMAL
  Filled 2017-01-24: qty 30

## 2017-01-24 MED ORDER — BACITRACIN ZINC 500 UNIT/GM EX OINT
1.0000 "application " | TOPICAL_OINTMENT | Freq: Two times a day (BID) | CUTANEOUS | Status: DC
  Administered 2017-01-24: 1 via TOPICAL
  Filled 2017-01-24: qty 0.9

## 2017-01-24 NOTE — ED Provider Notes (Signed)
[facility] EMERGENCY DEPARTMENT Provider Note   CSN: [id number] Arrival date & time: 01/24/17  1717     History   Chief Complaint Chief Complaint  Patient presents with  . Loss of Consciousness  . Laceration    HPI Jenna Booth is a 71 y.o. female.  HPI Patient presents after an episode of syncope. Patient was in her usual state of health, when she had a brief period of lightheadedness, and then awoke on the ground with pain in her left temple. Patient denies chest pain either before or afterwards, seems to recall events both preceding and subsequent to the episode. She states that she is a generally healthy lady, but on review notes that she had a subdural hematoma a few years ago. In addition, over the past year the patient has been trying to lose weight, has lost approximately 70 pounds. Tenderness.  She has been dieting, but not exercising. She is here with her sister and brother-in-law who assist with the HPI. Since the fall she has had pain focally in the left temple, nonradiating, sore, severe, with no vision loss. There is active bleeding from this site.   Past Medical History:  Diagnosis Date  . Acid reflux     There are no active problems to display for this patient.   Past Surgical History:  Procedure Laterality Date  . CATARACT EXTRACTION W/PHACO Left 10/12/2012   Procedure: CATARACT EXTRACTION PHACO AND INTRAOCULAR LENS PLACEMENT (IOC);  Surgeon: Tonny Branch, MD;  Location: [facility];  Service: Ophthalmology;  Laterality: Left;  CDE:16.97  . CRANIOTOMY Left 06/09/2012   Procedure: CRANIOTOMY HEMATOMA EVACUATION SUBDURAL;  Surgeon: Floyce Stakes, MD;  Location: [facility];  Service: Neurosurgery;  Laterality: Left;  . RHINOPLASTY      OB History    No data available       Home Medications    Prior to Admission medications   Medication Sig Start Date End Date Taking? Authorizing Provider  loratadine (CLARITIN) 10 MG tablet Take 10 mg by mouth  daily.    [provider]  meloxicam (MOBIC) 7.5 MG tablet Take 7.5 mg by mouth daily.    [provider]  Multiple Vitamin (MULTIVITAMIN WITH MINERALS) TABS Take 1 tablet by mouth daily.    [provider]  omeprazole (PRILOSEC) 10 MG capsule Take 10 mg by mouth daily.    [provider]  polyethylene glycol (MIRALAX / GLYCOLAX) packet Take 17 g by mouth daily.    [provider]  Probiotic Product (PROBIOTIC DAILY PO) Take 1 tablet by mouth daily.    [provider]  Specialty Vitamins Products (MAGNESIUM, AMINO ACID CHELATE,) 133 MG tablet Take 1 tablet by mouth daily.    [provider]  traMADol (ULTRAM) 50 MG tablet Take 50 mg by mouth as needed.    [provider]    Family History No family history on file.  Social History Social History   Tobacco Use  . Smoking status: Never Smoker  . Smokeless tobacco: Never Used  Substance Use Topics  . Alcohol use: No  . Drug use: No     Allergies   Patient has no known allergies.   Review of Systems Review of Systems  Constitutional:       Per HPI, otherwise negative  HENT:       Per HPI, otherwise negative  Respiratory:       Per HPI, otherwise negative  Cardiovascular:  Per HPI, otherwise negative  Gastrointestinal: Negative for vomiting.  Endocrine:       Negative aside from HPI  Genitourinary:       Neg aside from HPI   Musculoskeletal:       Per HPI, otherwise negative  Skin: Positive for wound.  Neurological: Positive for syncope and light-headedness.     Physical Exam Updated Vital Signs BP 116/60 (BP Location: Right Arm)   Pulse 71   Temp (!) 96.9 F (36.1 C) (Tympanic)   Resp 20   Ht 5' (1.524 m)   Wt 57.6 kg (127 lb)   SpO2 100%   BMI 24.80 kg/m   Physical Exam  Constitutional: She is oriented to person, place, and time. She appears well-developed and well-nourished. No distress.  HENT:  Head: Normocephalic.     Eyes: Conjunctivae and EOM are normal.  Cardiovascular: Normal rate and regular rhythm.  Pulmonary/Chest: Effort normal and breath sounds normal. No stridor. No respiratory distress.  Abdominal: She exhibits no distension.  Musculoskeletal: She exhibits no edema.  Neurological: She is alert and oriented to person, place, and time. No cranial nerve deficit.  Skin: Skin is warm and dry.  Psychiatric: She has a normal mood and affect.  Nursing note and vitals reviewed.    ED Treatments / Results  Labs (all labs ordered are listed, but only abnormal results are displayed) Labs Reviewed  CBC WITH DIFFERENTIAL/PLATELET - Abnormal; Notable for the following components:      Result Value   WBC 11.2 (*)    Neutro Abs 9.1 (*)    All other components within normal limits  COMPREHENSIVE METABOLIC PANEL - Abnormal; Notable for the following components:   Glucose, Bld 190 (*)    BUN 34 (*)    All other components within normal limits  URINALYSIS, ROUTINE W REFLEX MICROSCOPIC    EKG  EKG Interpretation  Date/Time:  Friday January 24 2017 17:41:32 EST Ventricular Rate:  60 PR Interval:    QRS Duration: 96 QT Interval:  439 QTC Calculation: 439 R Axis:   -21 Text Interpretation:  Sinus rhythm Borderline left axis deviation Non-specific intra-ventricular conduction delay Artifact T wave abnormality Confirmed by Carmin Muskrat [phone]) on 01/24/2017 5:58:18 PM       Radiology No results found.  Procedures .Marland KitchenLaceration Repair Date/Time: 01/24/2017 7:30 PM Performed by: Carmin Muskrat, MD Authorized by: Carmin Muskrat, MD   Consent:    Consent obtained:  Verbal   Consent given by:  Patient   Risks discussed:  Infection, need for additional repair, nerve damage, poor wound healing, poor cosmetic result, pain and vascular damage   Alternatives discussed:  No treatment Universal protocol:    Procedure explained and questions answered to patient or proxy's satisfaction:  yes     Immediately prior to procedure, a time out was called: yes     Patient identity confirmed:  Verbally with patient Anesthesia (see MAR for exact dosages):    Anesthesia method:  Local infiltration   Local anesthetic:  Lidocaine 1% WITH epi Laceration details:    Location:  Face   Face location:  L upper eyelid   Extent:  Full thickness   Length (cm):  4   Depth (mm):  5 Repair type:    Repair type:  Intermediate (wound on eyelid and L margin w proximity to muscle) Pre-procedure details:    Preparation:  Patient was prepped and draped in usual sterile fashion Exploration:    Hemostasis achieved with:  Direct  pressure   Wound exploration: wound explored through full range of motion and entire depth of wound probed and visualized     Wound extent: vascular damage     Contaminated: no   Treatment:    Area cleansed with:  Saline   Amount of cleaning:  Standard   Irrigation solution:  Sterile saline   Irrigation volume:  100   Irrigation method:  Syringe   Visualized foreign bodies/material removed: no   Skin repair:    Repair method:  Sutures   Suture size:  6-0   Suture material:  Prolene   Number of sutures:  4 Approximation:    Approximation:  Close   Vermilion border: well-aligned   Post-procedure details:    Dressing:  Antibiotic ointment   Patient tolerance of procedure:  Tolerated well, no immediate complications   (including critical care time)  Medications Ordered in ED Medications  sodium chloride 0.9 % bolus 1,000 mL (1,000 mLs Intravenous New Bag/Given 01/24/17 1815)     Initial Impression / Assessment and Plan / ED Course  I have reviewed the triage vital signs and the nursing notes.  Pertinent labs & imaging results that were available during my care of the patient were reviewed by me and considered in my medical decision making (see chart for details).  7:32 PM Patient tolerated suture repair of her face laceration well. She remains awake and  alert. Initial CT imaging notable for fracture of anterior and posterior lateral wall of the left maxillary sinus, with some hemocytes. No intracranial pathology. X-ray unremarkable, labs unremarkable, EKG unremarkable.  Final Clinical Impressions(s) / ED Diagnoses  Patient presents after an episode of syncope and fall with obvious facial trauma. Here patient is awake alert, neurologically intact, hemodynamically stable, though she has pain and swelling throughout the left face, maxilla, and left eyelid margin. Patient has preserved extraocular eye motion, and visual capacity. Patient had suture repair of left eyelid lesion, performed without complication. The patient's studies otherwise reassuring aside from evidence for new sinus fracture. Patient started on inhaled saline, analgesia, will follow up with ENT as well as primary care. Etiology for her episode not entirely clear, though there is no evidence for sustained arrhythmia.  The patient's endorsement of substantial weight loss via dieting without exercise suggests possible dehydration. Here the patient received fluid resuscitation. After no additional episodes, no sustained arrhythmia and with the aforementioned reassuring findings, patient was discharged in stable condition.   Diagnosis: Fall, initial encounter Facial laceration Fracture of maxillary sinus   Carmin Muskrat, MD 01/24/17 2026

## 2017-01-24 NOTE — ED Notes (Signed)
Pt alert & oriented x4, stable gait. Patient given discharge instructions, paperwork & prescription(s). Patient verbalized understanding. Pt left department in wheelchair escorted by staff. Pt left w/ no further questions. 

## 2017-01-24 NOTE — Discharge Instructions (Signed)
As discussed, with today's presentation and evaluation for loss of consciousness, as well as your facial laceration and fracture, it is important that he follow-up with both your primary care physician and our ENT specialist.  Your sutures need to be removed in 5-7 days.  Return here for concerning changes in your condition.

## 2017-01-24 NOTE — ED Notes (Signed)
Lab at bedside obtaining blood sample.  

## 2017-01-24 NOTE — ED Triage Notes (Signed)
Pt lives alone  Reports passed out Family states pt called about 30 minutes ago Pt with lac to her L parietal area

## 2017-01-24 NOTE — ED Notes (Signed)
ED Provider at bedside. 

## 2017-01-29 DIAGNOSIS — S01112A Laceration without foreign body of left eyelid and periocular area, initial encounter: Secondary | ICD-10-CM | POA: Diagnosis not present

## 2017-02-05 DIAGNOSIS — E782 Mixed hyperlipidemia: Secondary | ICD-10-CM | POA: Diagnosis not present

## 2017-02-07 DIAGNOSIS — E6609 Other obesity due to excess calories: Secondary | ICD-10-CM | POA: Diagnosis not present

## 2017-02-07 DIAGNOSIS — H9319 Tinnitus, unspecified ear: Secondary | ICD-10-CM | POA: Diagnosis not present

## 2017-02-07 DIAGNOSIS — R55 Syncope and collapse: Secondary | ICD-10-CM | POA: Diagnosis not present

## 2017-02-07 DIAGNOSIS — R7301 Impaired fasting glucose: Secondary | ICD-10-CM | POA: Diagnosis not present

## 2017-02-07 DIAGNOSIS — R69 Illness, unspecified: Secondary | ICD-10-CM | POA: Diagnosis not present

## 2017-02-07 DIAGNOSIS — I7300 Raynaud's syndrome without gangrene: Secondary | ICD-10-CM | POA: Diagnosis not present

## 2017-02-07 DIAGNOSIS — I10 Essential (primary) hypertension: Secondary | ICD-10-CM | POA: Diagnosis not present

## 2017-02-07 DIAGNOSIS — E782 Mixed hyperlipidemia: Secondary | ICD-10-CM | POA: Diagnosis not present

## 2017-02-07 DIAGNOSIS — M810 Age-related osteoporosis without current pathological fracture: Secondary | ICD-10-CM | POA: Diagnosis not present

## 2017-02-07 DIAGNOSIS — H26499 Other secondary cataract, unspecified eye: Secondary | ICD-10-CM | POA: Diagnosis not present

## 2017-02-10 ENCOUNTER — Other Ambulatory Visit: Payer: Self-pay | Admitting: Internal Medicine

## 2017-02-10 ENCOUNTER — Other Ambulatory Visit (HOSPITAL_COMMUNITY): Payer: Self-pay | Admitting: Internal Medicine

## 2017-02-10 DIAGNOSIS — R55 Syncope and collapse: Secondary | ICD-10-CM

## 2017-02-12 ENCOUNTER — Other Ambulatory Visit (HOSPITAL_COMMUNITY): Payer: Self-pay | Admitting: Internal Medicine

## 2017-02-12 ENCOUNTER — Ambulatory Visit (HOSPITAL_COMMUNITY)
Admission: RE | Admit: 2017-02-12 | Discharge: 2017-02-12 | Disposition: A | Discharge status: Home / Self Care | Payer: Medicare HMO | Source: Ambulatory Visit | Attending: Internal Medicine | Admitting: Internal Medicine

## 2017-02-12 DIAGNOSIS — R55 Syncope and collapse: Secondary | ICD-10-CM | POA: Insufficient documentation

## 2017-02-12 DIAGNOSIS — I6523 Occlusion and stenosis of bilateral carotid arteries: Secondary | ICD-10-CM | POA: Diagnosis not present

## 2017-02-24 DIAGNOSIS — H26492 Other secondary cataract, left eye: Secondary | ICD-10-CM | POA: Diagnosis not present

## 2017-02-24 DIAGNOSIS — H2511 Age-related nuclear cataract, right eye: Secondary | ICD-10-CM | POA: Diagnosis not present

## 2017-03-07 ENCOUNTER — Other Ambulatory Visit: Payer: Self-pay

## 2017-03-07 ENCOUNTER — Ambulatory Visit: Payer: Medicare HMO | Admitting: Cardiology

## 2017-03-07 ENCOUNTER — Encounter: Payer: Self-pay | Admitting: Cardiology

## 2017-03-07 VITALS — BP 110/80 | HR 155 | Ht 59.0 in | Wt 130.0 lb

## 2017-03-07 DIAGNOSIS — I951 Orthostatic hypotension: Secondary | ICD-10-CM | POA: Diagnosis not present

## 2017-03-07 NOTE — Patient Instructions (Signed)
Medication Instructions:  Your physician recommends that you continue on your current medications as directed. Please refer to the Current Medication list given to you today.   Labwork: NONE  Testing/Procedures: NONE  Follow-Up: Your physician recommends that you schedule a follow-up appointment in: 1 MONTH   Any Other Special Instructions Will Be Listed Below (If Applicable).     If you need a refill on your cardiac medications before your next appointment, please call your pharmacy.   

## 2017-03-07 NOTE — Progress Notes (Signed)
Clinical Summary Ms. Levi is a 72 y.o.female see nas new consult, referred by Dr Nevada Crane for syncope.   1. Syncope - seen in ER 01/24/17 with syncope. Isolated episode - occurred at home. Occurred while standing. She had not felt well that days. Started having some abdominal pain. Sat on commode, felt very sweaty. Stood up and walked to kitchen. Put dinner in microwave. Next thing she knows was laying on the floor next to fridge - no prior syncopal events - no significant orthostatic symptoms  - recent rapid weight loss - Jan 2019 carotid US without significant disease - CT head no intracranial process - 8 oz of water bottles x2 daily , 2 diet sodas.    Past Medical History:  Diagnosis Date  . Acid reflux      No Known Allergies   Current Outpatient Medications  Medication Sig Dispense Refill  . loratadine (CLARITIN) 10 MG tablet Take 10 mg by mouth daily.    . meloxicam (MOBIC) 7.5 MG tablet Take 7.5 mg by mouth daily.    . Multiple Vitamin (MULTIVITAMIN WITH MINERALS) TABS Take 1 tablet by mouth daily.    Marland Kitchen omeprazole (PRILOSEC) 10 MG capsule Take 10 mg by mouth daily.    . polyethylene glycol (MIRALAX / GLYCOLAX) packet Take 17 g by mouth daily.    . Probiotic Product (PROBIOTIC DAILY PO) Take 1 tablet by mouth daily.    Marland Kitchen Specialty Vitamins Products (MAGNESIUM, AMINO ACID CHELATE,) 133 MG tablet Take 1 tablet by mouth daily.    . traMADol (ULTRAM) 50 MG tablet Take 50 mg by mouth as needed.     No current facility-administered medications for this visit.      Past Surgical History:  Procedure Laterality Date  . CATARACT EXTRACTION W/PHACO Left 10/12/2012   Procedure: CATARACT EXTRACTION PHACO AND INTRAOCULAR LENS PLACEMENT (IOC);  Surgeon: Tonny , MD;  Location: [facility];  Service: Ophthalmology;  Laterality: Left;  CDE:16.97  . CRANIOTOMY Left 06/09/2012   Procedure: CRANIOTOMY HEMATOMA EVACUATION SUBDURAL;  Surgeon: Floyce Stakes, MD;  Location: [facility];  Service: Neurosurgery;  Laterality: Left;  . RHINOPLASTY       No Known Allergies    No family history on file.   Social History Ms. Hunkele reports that  has never smoked. she has never used smokeless tobacco. Ms. Wiesman reports that she does not drink alcohol.   Review of Systems CONSTITUTIONAL: No weight loss, fever, chills, weakness or fatigue.  HEENT: Eyes: No visual loss, blurred vision, double vision or yellow sclerae.No hearing loss, sneezing, congestion, runny nose or sore throat.  SKIN: No rash or itching.  CARDIOVASCULAR: per hpi RESPIRATORY: No shortness of breath, cough or sputum.  GASTROINTESTINAL: No anorexia, nausea, vomiting or diarrhea. No abdominal pain or blood.  GENITOURINARY: No burning on urination, no polyuria NEUROLOGICAL: per hpi  MUSCULOSKELETAL: No muscle, back pain, joint pain or stiffness.  LYMPHATICS: No enlarged nodes. No history of splenectomy.  PSYCHIATRIC: No history of depression or anxiety.  ENDOCRINOLOGIC: No reports of sweating, cold or heat intolerance. No polyuria or polydipsia.  Marland Kitchen   Physical Examination Vitals:   03/07/17 1019 03/07/17 1021  BP: 98/80 110/80  Pulse: (!) 109 (!) 155  SpO2: 98% 95%   Vitals:   03/07/17 1008  Weight: 130 lb (59 kg)  Height: 4\' 11"  (1.499 m)    Gen: resting comfortably, no acute distress HEENT: no scleral icterus, pupils equal round and reactive, no palptable  cervical adenopathy,  CV: RRR, no /mr/g, no jvd Resp: Clear to auscultation bilaterally GI: abdomen is soft, non-tender, non-distended, normal bowel sounds, no hepatosplenomegaly MSK: extremities are warm, no edema.  Skin: warm, no rash Neuro:  no focal deficits Psych: appropriate affect    Assessment and Plan  1. Syncope - probable orthostatic syncope. Severely orthostatic in clinic. SBP 124 wit 98 with standing, HR 86 to 155.  - poor oral hydration, encouraged to aggressively hydrate. Pending symptoms consider  compression stockngs, florinef, or midodrine. May require a TSH and cortisol level as well if does not resolve with aggressive hydration      Arnoldo Lenis, M.D.

## 2017-03-12 ENCOUNTER — Encounter: Payer: Self-pay | Admitting: Cardiology

## 2017-04-01 DIAGNOSIS — H26499 Other secondary cataract, unspecified eye: Secondary | ICD-10-CM | POA: Diagnosis not present

## 2017-04-01 DIAGNOSIS — H9319 Tinnitus, unspecified ear: Secondary | ICD-10-CM | POA: Diagnosis not present

## 2017-04-01 DIAGNOSIS — R7301 Impaired fasting glucose: Secondary | ICD-10-CM | POA: Diagnosis not present

## 2017-04-01 DIAGNOSIS — I7300 Raynaud's syndrome without gangrene: Secondary | ICD-10-CM | POA: Diagnosis not present

## 2017-04-01 DIAGNOSIS — I69011 Memory deficit following nontraumatic subarachnoid hemorrhage: Secondary | ICD-10-CM | POA: Diagnosis not present

## 2017-04-01 DIAGNOSIS — H539 Unspecified visual disturbance: Secondary | ICD-10-CM | POA: Diagnosis not present

## 2017-04-01 DIAGNOSIS — R269 Unspecified abnormalities of gait and mobility: Secondary | ICD-10-CM | POA: Diagnosis not present

## 2017-04-01 DIAGNOSIS — Z6826 Body mass index (BMI) 26.0-26.9, adult: Secondary | ICD-10-CM | POA: Diagnosis not present

## 2017-04-01 DIAGNOSIS — Z23 Encounter for immunization: Secondary | ICD-10-CM | POA: Diagnosis not present

## 2017-04-01 DIAGNOSIS — R55 Syncope and collapse: Secondary | ICD-10-CM | POA: Diagnosis not present

## 2017-04-10 ENCOUNTER — Encounter: Payer: Self-pay | Admitting: Cardiology

## 2017-04-10 ENCOUNTER — Ambulatory Visit: Payer: Medicare HMO | Admitting: Cardiology

## 2017-04-10 ENCOUNTER — Other Ambulatory Visit (HOSPITAL_COMMUNITY)
Admission: RE | Admit: 2017-04-10 | Discharge: 2017-04-10 | Disposition: A | Discharge status: Home / Self Care | Payer: Medicare HMO | Source: Ambulatory Visit | Attending: Cardiology | Admitting: Cardiology

## 2017-04-10 VITALS — BP 140/78 | HR 90 | Ht 59.0 in | Wt 130.0 lb

## 2017-04-10 DIAGNOSIS — R000 Tachycardia, unspecified: Secondary | ICD-10-CM | POA: Insufficient documentation

## 2017-04-10 DIAGNOSIS — I951 Orthostatic hypotension: Secondary | ICD-10-CM | POA: Diagnosis not present

## 2017-04-10 LAB — TSH: TSH: 2.63 u[IU]/mL (ref 0.350–4.500)

## 2017-04-10 LAB — CORTISOL: CORTISOL PLASMA: 9.2 ug/dL

## 2017-04-10 NOTE — Patient Instructions (Signed)
Medication Instructions:  Your physician recommends that you continue on your current medications as directed. Please refer to the Current Medication list given to you today.   Labwork: TODAY   Testing/Procedures: NONE  Follow-Up: Your physician wants you to follow-up in: 6 MONTHS. You will receive a reminder letter in the mail two months in advance. If you don't receive a letter, please call our office to schedule the follow-up appointment.   Any Other Special Instructions Will Be Listed Below (If Applicable).     If you need a refill on your cardiac medications before your next appointment, please call your pharmacy.   

## 2017-04-10 NOTE — Progress Notes (Signed)
Clinical Summary Jenna Booth is a 72 y.o.female seen today for follow up of the following medical problems.   1. Syncope - seen in ER 01/24/17 with syncope. Isolated episode - occurred at home. Occurred while standing. She had not felt well that days. Started having some abdominal pain. Sat on commode, felt very sweaty. Stood up and walked to kitchen. Put dinner in microwave. Next thing she knows was laying on the floor next to fridge - no prior syncopal events - no significant orthostatic symptoms  - recent rapid weight loss - Jan 2019 carotid US without significant disease - CT head no intracranial process  - no recurrent episodes. Working to increase hydration.      Past Medical History:  Diagnosis Date  . Acid reflux      No Known Allergies   Current Outpatient Medications  Medication Sig Dispense Refill  . glucosamine-chondroitin 500-400 MG tablet Take 1 tablet by mouth 2 (two) times daily.    Marland Kitchen loratadine (CLARITIN) 10 MG tablet Take 10 mg by mouth daily.    . Magnesium 250 MG TABS Take 1 tablet by mouth daily.    . meloxicam (MOBIC) 7.5 MG tablet Take 7.5 mg by mouth 2 (two) times daily.     . Methylsulfonylmethane (MSM) 1000 MG CAPS Take 1 capsule by mouth daily.    . Multiple Vitamin (MULTIVITAMIN WITH MINERALS) TABS Take 1 tablet by mouth daily.    Marland Kitchen omeprazole (PRILOSEC) 20 MG capsule Take 1 capsule by mouth daily.    . polyethylene glycol (MIRALAX / GLYCOLAX) packet Take 17 g by mouth daily.    . Probiotic Product (PROBIOTIC DAILY PO) Take 1 tablet by mouth daily.    . traMADol (ULTRAM) 50 MG tablet Take 50 mg by mouth as needed.     No current facility-administered medications for this visit.      Past Surgical History:  Procedure Laterality Date  . CATARACT EXTRACTION W/PHACO Left 10/12/2012   Procedure: CATARACT EXTRACTION PHACO AND INTRAOCULAR LENS PLACEMENT (IOC);  Surgeon: Tonny Branch, MD;  Location: [facility];  Service: Ophthalmology;  Laterality:  Left;  CDE:16.97  . CRANIOTOMY Left 06/09/2012   Procedure: CRANIOTOMY HEMATOMA EVACUATION SUBDURAL;  Surgeon: Floyce Stakes, MD;  Location: [facility];  Service: Neurosurgery;  Laterality: Left;  . RHINOPLASTY       No Known Allergies    Family History  Problem Relation Age of Onset  . Ulcerative colitis Mother   . Thyroid disease Mother        goiter  . Lung cancer Father   . Cardiomyopathy Sister        takotsubu  . Hyperlipidemia Sister   . Hypertension Sister   . Thyroid disease Maternal Grandfather        died during operation for a goiter  . Other Paternal Grandmother        flu epidemic  . Other Paternal Grandfather        flu epidemic     Social History Ms. Goldwasser reports that  has never smoked. she has never used smokeless tobacco. Ms. Rosenstock reports that she does not drink alcohol.   Review of Systems CONSTITUTIONAL: No weight loss, fever, chills, weakness or fatigue.  HEENT: Eyes: No visual loss, blurred vision, double vision or yellow sclerae.No hearing loss, sneezing, congestion, runny nose or sore throat.  SKIN: No rash or itching.  CARDIOVASCULAR: no chest pain, no palpitations.  RESPIRATORY: No shortness of breath, cough or  sputum.  GASTROINTESTINAL: No anorexia, nausea, vomiting or diarrhea. No abdominal pain or blood.  GENITOURINARY: No burning on urination, no polyuria NEUROLOGICAL: No headache, dizziness, syncope, paralysis, ataxia, numbness or tingling in the extremities. No change in bowel or bladder control.  MUSCULOSKELETAL: No muscle, back pain, joint pain or stiffness.  LYMPHATICS: No enlarged nodes. No history of splenectomy.  PSYCHIATRIC: No history of depression or anxiety.  ENDOCRINOLOGIC: No reports of sweating, cold or heat intolerance. No polyuria or polydipsia.  Marland Kitchen   Physical Examination   Vitals:   04/10/17 1259  BP: 140/78  Pulse: 90  SpO2: 92%   Vitals:   04/10/17 1259  Weight: 130 lb (59 kg)  Height: 4\' 11"   (1.499 m)    Gen: resting comfortably, no acute distress HEENT: no scleral icterus, pupils equal round and reactive, no palptable cervical adenopathy,  CV: RRR, no mr/g, no jvd Resp: Clear to auscultation bilaterally GI: abdomen is soft, non-tender, non-distended, normal bowel sounds, no hepatosplenomegaly MSK: extremities are warm, no edema.  Skin: warm, no rash Neuro:  no focal deficits Psych: appropriate affect      Assessment and Plan   1. Syncope - probable orthostatic syncope. Severely orthostatic in clinic previously . SBP 124 wit 98 with standing, HR 86 to 155.  - symptoms improved with increased hydration, we will continue to moniotor at this time - if recurrence could consider midodrine or florinef     Arnoldo Lenis, M.D.

## 2017-05-07 ENCOUNTER — Encounter: Payer: Self-pay | Admitting: Cardiology

## 2017-05-16 DIAGNOSIS — Z23 Encounter for immunization: Secondary | ICD-10-CM | POA: Diagnosis not present

## 2017-05-16 DIAGNOSIS — I69011 Memory deficit following nontraumatic subarachnoid hemorrhage: Secondary | ICD-10-CM | POA: Diagnosis not present

## 2017-05-16 DIAGNOSIS — H539 Unspecified visual disturbance: Secondary | ICD-10-CM | POA: Diagnosis not present

## 2017-05-16 DIAGNOSIS — I7300 Raynaud's syndrome without gangrene: Secondary | ICD-10-CM | POA: Diagnosis not present

## 2017-05-16 DIAGNOSIS — R55 Syncope and collapse: Secondary | ICD-10-CM | POA: Diagnosis not present

## 2017-05-16 DIAGNOSIS — H26499 Other secondary cataract, unspecified eye: Secondary | ICD-10-CM | POA: Diagnosis not present

## 2017-05-16 DIAGNOSIS — R269 Unspecified abnormalities of gait and mobility: Secondary | ICD-10-CM | POA: Diagnosis not present

## 2017-05-16 DIAGNOSIS — Z6826 Body mass index (BMI) 26.0-26.9, adult: Secondary | ICD-10-CM | POA: Diagnosis not present

## 2017-05-16 DIAGNOSIS — R7301 Impaired fasting glucose: Secondary | ICD-10-CM | POA: Diagnosis not present

## 2017-05-16 DIAGNOSIS — H9319 Tinnitus, unspecified ear: Secondary | ICD-10-CM | POA: Diagnosis not present

## 2017-05-20 DIAGNOSIS — M810 Age-related osteoporosis without current pathological fracture: Secondary | ICD-10-CM | POA: Diagnosis not present

## 2017-05-20 DIAGNOSIS — I7300 Raynaud's syndrome without gangrene: Secondary | ICD-10-CM | POA: Diagnosis not present

## 2017-05-20 DIAGNOSIS — R7303 Prediabetes: Secondary | ICD-10-CM | POA: Diagnosis not present

## 2017-05-20 DIAGNOSIS — H9311 Tinnitus, right ear: Secondary | ICD-10-CM | POA: Diagnosis not present

## 2017-05-20 DIAGNOSIS — E782 Mixed hyperlipidemia: Secondary | ICD-10-CM | POA: Diagnosis not present

## 2017-05-20 DIAGNOSIS — R69 Illness, unspecified: Secondary | ICD-10-CM | POA: Diagnosis not present

## 2017-05-20 DIAGNOSIS — I10 Essential (primary) hypertension: Secondary | ICD-10-CM | POA: Diagnosis not present

## 2017-05-20 DIAGNOSIS — E6609 Other obesity due to excess calories: Secondary | ICD-10-CM | POA: Diagnosis not present

## 2017-05-20 DIAGNOSIS — R7301 Impaired fasting glucose: Secondary | ICD-10-CM | POA: Diagnosis not present

## 2017-06-11 DIAGNOSIS — R55 Syncope and collapse: Secondary | ICD-10-CM | POA: Diagnosis not present

## 2017-06-11 DIAGNOSIS — G3184 Mild cognitive impairment, so stated: Secondary | ICD-10-CM | POA: Diagnosis not present

## 2017-06-11 DIAGNOSIS — R569 Unspecified convulsions: Secondary | ICD-10-CM | POA: Diagnosis not present

## 2017-06-11 DIAGNOSIS — M130 Polyarthritis, unspecified: Secondary | ICD-10-CM | POA: Diagnosis not present

## 2017-06-27 DIAGNOSIS — R569 Unspecified convulsions: Secondary | ICD-10-CM | POA: Diagnosis not present

## 2017-10-10 ENCOUNTER — Encounter: Payer: Self-pay | Admitting: Cardiology

## 2017-10-10 ENCOUNTER — Ambulatory Visit: Payer: Medicare HMO | Admitting: Cardiology

## 2017-10-10 VITALS — BP 134/86 | HR 63 | Ht 59.0 in | Wt 126.6 lb

## 2017-10-10 DIAGNOSIS — I951 Orthostatic hypotension: Secondary | ICD-10-CM

## 2017-10-10 NOTE — Patient Instructions (Addendum)
Medication Instructions:  Your physician recommends that you continue on your current medications as directed. Please refer to the Current Medication list given to you today.   Labwork: NONE  Testing/Procedures: NONE  Follow-Up: Your physician recommends that you schedule a follow-up appointment in: AS NEEDED      Any Other Special Instructions Will Be Listed Below (If Applicable).     If you need a refill on your cardiac medications before your next appointment, please call your pharmacy.   

## 2017-10-10 NOTE — Progress Notes (Signed)
Clinical Summary Jenna Booth is a 72 y.o.female seen today for follow up of the following medical problems.   1. Syncope - seen in ER 01/24/17 with syncope. Isolated episode - occurred at home. Occurred while standing. She had not felt well that day. Started having some abdominal pain. Sat on commode, felt very sweaty. Stood up and walked to kitchen. Put dinner in microwave. Next thing she knows was laying on the floor next to fridge - no prior syncopal events - no significant orthostatic symptoms - recent rapid weight loss - Jan 2019 carotid US without significant disease - CT head no intracranial process    - no recurrent episodes. Mild infrequent orthostatic symptoms.  - has increased sodium intake.  - exercising 30 minutes a day without troubles    Past Medical History:  Diagnosis Date  . Acid reflux      No Known Allergies   Current Outpatient Medications  Medication Sig Dispense Refill  . glucosamine-chondroitin 500-400 MG tablet Take 1 tablet by mouth 2 (two) times daily.    Marland Kitchen loratadine (CLARITIN) 10 MG tablet Take 10 mg by mouth daily.    . Magnesium 250 MG TABS Take 1 tablet by mouth daily.    . meloxicam (MOBIC) 7.5 MG tablet Take 7.5 mg by mouth 2 (two) times daily.     . Methylsulfonylmethane (MSM) 1000 MG CAPS Take 1 capsule by mouth daily.    . Multiple Vitamin (MULTIVITAMIN WITH MINERALS) TABS Take 1 tablet by mouth daily.    Marland Kitchen omeprazole (PRILOSEC) 20 MG capsule Take 1 capsule by mouth daily.    . polyethylene glycol (MIRALAX / GLYCOLAX) packet Take 17 g by mouth daily.    . Probiotic Product (PROBIOTIC DAILY PO) Take 1 tablet by mouth daily.    . traMADol (ULTRAM) 50 MG tablet Take 50 mg by mouth as needed.     No current facility-administered medications for this visit.      Past Surgical History:  Procedure Laterality Date  . CATARACT EXTRACTION W/PHACO Left 10/12/2012   Procedure: CATARACT EXTRACTION PHACO AND INTRAOCULAR LENS PLACEMENT  (IOC);  Surgeon: Tonny Branch, MD;  Location: [facility];  Service: Ophthalmology;  Laterality: Left;  CDE:16.97  . CRANIOTOMY Left 06/09/2012   Procedure: CRANIOTOMY HEMATOMA EVACUATION SUBDURAL;  Surgeon: Floyce Stakes, MD;  Location: [facility];  Service: Neurosurgery;  Laterality: Left;  . RHINOPLASTY       No Known Allergies    Family History  Problem Relation Age of Onset  . Ulcerative colitis Mother   . Thyroid disease Mother        goiter  . Lung cancer Father   . Cardiomyopathy Sister        takotsubu  . Hyperlipidemia Sister   . Hypertension Sister   . Thyroid disease Maternal Grandfather        died during operation for a goiter  . Other Paternal Grandmother        flu epidemic  . Other Paternal Grandfather        flu epidemic     Social History Jenna Booth reports that she has never smoked. She has never used smokeless tobacco. Jenna Booth reports that she does not drink alcohol.   Review of Systems CONSTITUTIONAL: No weight loss, fever, chills, weakness or fatigue.  HEENT: Eyes: No visual loss, blurred vision, double vision or yellow sclerae.No hearing loss, sneezing, congestion, runny nose or sore throat.  SKIN: No rash or itching.  CARDIOVASCULAR: no chest pain, no palpitations.  RESPIRATORY: No shortness of breath, cough or sputum.  GASTROINTESTINAL: No anorexia, nausea, vomiting or diarrhea. No abdominal pain or blood.  GENITOURINARY: No burning on urination, no polyuria NEUROLOGICAL: No headache, dizziness, syncope, paralysis, ataxia, numbness or tingling in the extremities. No change in bowel or bladder control.  MUSCULOSKELETAL: No muscle, back pain, joint pain or stiffness.  LYMPHATICS: No enlarged nodes. No history of splenectomy.  PSYCHIATRIC: No history of depression or anxiety.  ENDOCRINOLOGIC: No reports of sweating, cold or heat intolerance. No polyuria or polydipsia.  Marland Kitchen   Physical Examination Vitals:   10/10/17 1033  BP: 134/86    Pulse: 63  SpO2: 99%   Vitals:   10/10/17 1033  Weight: 126 lb 9.6 oz (57.4 kg)  Height: 4\' 11"  (1.499 m)    Gen: resting comfortably, no acute distress HEENT: no scleral icterus, pupils equal round and reactive, no palptable cervical adenopathy,  CV: RRR, no m/r/g, no jvd Resp: Clear to auscultation bilaterally GI: abdomen is soft, non-tender, non-distended, normal bowel sounds, no hepatosplenomegaly MSK: extremities are warm, no edema.  Skin: warm, no rash Neuro:  no focal deficits Psych: appropriate affect      Assessment and Plan  1. Syncope - probable orthostatic syncope. Severely orthostatic in clinic previously . SBP 124 with 98 with standing, HR 86 to 155.  - no recurrent episodes since maintaing aggressive hydration, increased sodium intake - continue to monitor at this time. If recurrence could consider compression stockings, possibly florinef or midodrine   F/u as needed      Arnoldo Lenis, M.D.

## 2017-11-19 DIAGNOSIS — Z6826 Body mass index (BMI) 26.0-26.9, adult: Secondary | ICD-10-CM | POA: Diagnosis not present

## 2017-11-19 DIAGNOSIS — H9319 Tinnitus, unspecified ear: Secondary | ICD-10-CM | POA: Diagnosis not present

## 2017-11-19 DIAGNOSIS — H9311 Tinnitus, right ear: Secondary | ICD-10-CM | POA: Diagnosis not present

## 2017-11-19 DIAGNOSIS — R319 Hematuria, unspecified: Secondary | ICD-10-CM | POA: Diagnosis not present

## 2017-11-19 DIAGNOSIS — H26499 Other secondary cataract, unspecified eye: Secondary | ICD-10-CM | POA: Diagnosis not present

## 2017-11-19 DIAGNOSIS — R55 Syncope and collapse: Secondary | ICD-10-CM | POA: Diagnosis not present

## 2017-11-19 DIAGNOSIS — G3184 Mild cognitive impairment, so stated: Secondary | ICD-10-CM | POA: Diagnosis not present

## 2017-11-19 DIAGNOSIS — Z23 Encounter for immunization: Secondary | ICD-10-CM | POA: Diagnosis not present

## 2017-11-19 DIAGNOSIS — I7300 Raynaud's syndrome without gangrene: Secondary | ICD-10-CM | POA: Diagnosis not present

## 2017-11-19 DIAGNOSIS — R7303 Prediabetes: Secondary | ICD-10-CM | POA: Diagnosis not present

## 2017-11-21 DIAGNOSIS — R7301 Impaired fasting glucose: Secondary | ICD-10-CM | POA: Diagnosis not present

## 2017-11-21 DIAGNOSIS — E6609 Other obesity due to excess calories: Secondary | ICD-10-CM | POA: Diagnosis not present

## 2017-11-21 DIAGNOSIS — M810 Age-related osteoporosis without current pathological fracture: Secondary | ICD-10-CM | POA: Diagnosis not present

## 2017-11-21 DIAGNOSIS — I7300 Raynaud's syndrome without gangrene: Secondary | ICD-10-CM | POA: Diagnosis not present

## 2017-11-21 DIAGNOSIS — H9319 Tinnitus, unspecified ear: Secondary | ICD-10-CM | POA: Diagnosis not present

## 2017-11-21 DIAGNOSIS — I10 Essential (primary) hypertension: Secondary | ICD-10-CM | POA: Diagnosis not present

## 2017-11-21 DIAGNOSIS — R69 Illness, unspecified: Secondary | ICD-10-CM | POA: Diagnosis not present

## 2017-11-21 DIAGNOSIS — R55 Syncope and collapse: Secondary | ICD-10-CM | POA: Diagnosis not present

## 2017-11-21 DIAGNOSIS — E782 Mixed hyperlipidemia: Secondary | ICD-10-CM | POA: Diagnosis not present

## 2017-11-26 DIAGNOSIS — Z23 Encounter for immunization: Secondary | ICD-10-CM | POA: Diagnosis not present

## 2018-02-25 DIAGNOSIS — M1990 Unspecified osteoarthritis, unspecified site: Secondary | ICD-10-CM | POA: Diagnosis not present

## 2018-02-25 DIAGNOSIS — R69 Illness, unspecified: Secondary | ICD-10-CM | POA: Diagnosis not present

## 2018-03-25 DIAGNOSIS — R69 Illness, unspecified: Secondary | ICD-10-CM | POA: Diagnosis not present

## 2018-03-25 DIAGNOSIS — I7300 Raynaud's syndrome without gangrene: Secondary | ICD-10-CM | POA: Diagnosis not present

## 2018-03-25 DIAGNOSIS — M1990 Unspecified osteoarthritis, unspecified site: Secondary | ICD-10-CM | POA: Diagnosis not present

## 2018-05-06 DIAGNOSIS — M1990 Unspecified osteoarthritis, unspecified site: Secondary | ICD-10-CM | POA: Diagnosis not present

## 2018-05-06 DIAGNOSIS — R69 Illness, unspecified: Secondary | ICD-10-CM | POA: Diagnosis not present

## 2018-05-14 DIAGNOSIS — Z0000 Encounter for general adult medical examination without abnormal findings: Secondary | ICD-10-CM | POA: Diagnosis not present

## 2018-06-03 DIAGNOSIS — F411 Generalized anxiety disorder: Secondary | ICD-10-CM | POA: Diagnosis not present

## 2018-06-03 DIAGNOSIS — R69 Illness, unspecified: Secondary | ICD-10-CM | POA: Diagnosis not present

## 2018-06-03 DIAGNOSIS — M25559 Pain in unspecified hip: Secondary | ICD-10-CM | POA: Diagnosis not present

## 2018-06-03 DIAGNOSIS — M25519 Pain in unspecified shoulder: Secondary | ICD-10-CM | POA: Diagnosis not present

## 2018-10-29 DIAGNOSIS — Z7689 Persons encountering health services in other specified circumstances: Secondary | ICD-10-CM | POA: Diagnosis not present

## 2018-10-29 DIAGNOSIS — R7301 Impaired fasting glucose: Secondary | ICD-10-CM | POA: Diagnosis not present

## 2018-10-29 DIAGNOSIS — I10 Essential (primary) hypertension: Secondary | ICD-10-CM | POA: Diagnosis not present

## 2018-10-29 DIAGNOSIS — E782 Mixed hyperlipidemia: Secondary | ICD-10-CM | POA: Diagnosis not present

## 2018-10-29 DIAGNOSIS — R7303 Prediabetes: Secondary | ICD-10-CM | POA: Diagnosis not present

## 2018-11-03 DIAGNOSIS — I10 Essential (primary) hypertension: Secondary | ICD-10-CM | POA: Diagnosis not present

## 2018-11-03 DIAGNOSIS — R69 Illness, unspecified: Secondary | ICD-10-CM | POA: Diagnosis not present

## 2018-11-03 DIAGNOSIS — R7301 Impaired fasting glucose: Secondary | ICD-10-CM | POA: Diagnosis not present

## 2018-11-03 DIAGNOSIS — R55 Syncope and collapse: Secondary | ICD-10-CM | POA: Diagnosis not present

## 2018-11-03 DIAGNOSIS — H9319 Tinnitus, unspecified ear: Secondary | ICD-10-CM | POA: Diagnosis not present

## 2018-11-03 DIAGNOSIS — I7300 Raynaud's syndrome without gangrene: Secondary | ICD-10-CM | POA: Diagnosis not present

## 2018-11-03 DIAGNOSIS — M810 Age-related osteoporosis without current pathological fracture: Secondary | ICD-10-CM | POA: Diagnosis not present

## 2018-11-03 DIAGNOSIS — E6609 Other obesity due to excess calories: Secondary | ICD-10-CM | POA: Diagnosis not present

## 2018-11-03 DIAGNOSIS — E782 Mixed hyperlipidemia: Secondary | ICD-10-CM | POA: Diagnosis not present

## 2018-12-01 DIAGNOSIS — I7300 Raynaud's syndrome without gangrene: Secondary | ICD-10-CM | POA: Diagnosis not present

## 2018-12-01 DIAGNOSIS — I10 Essential (primary) hypertension: Secondary | ICD-10-CM | POA: Diagnosis not present

## 2018-12-01 DIAGNOSIS — R7301 Impaired fasting glucose: Secondary | ICD-10-CM | POA: Diagnosis not present

## 2018-12-01 DIAGNOSIS — E782 Mixed hyperlipidemia: Secondary | ICD-10-CM | POA: Diagnosis not present

## 2018-12-01 DIAGNOSIS — E6609 Other obesity due to excess calories: Secondary | ICD-10-CM | POA: Diagnosis not present

## 2018-12-01 DIAGNOSIS — R69 Illness, unspecified: Secondary | ICD-10-CM | POA: Diagnosis not present

## 2018-12-01 DIAGNOSIS — R55 Syncope and collapse: Secondary | ICD-10-CM | POA: Diagnosis not present

## 2018-12-01 DIAGNOSIS — M810 Age-related osteoporosis without current pathological fracture: Secondary | ICD-10-CM | POA: Diagnosis not present

## 2018-12-01 DIAGNOSIS — G3184 Mild cognitive impairment, so stated: Secondary | ICD-10-CM | POA: Diagnosis not present

## 2018-12-02 ENCOUNTER — Other Ambulatory Visit (HOSPITAL_COMMUNITY): Payer: Self-pay | Admitting: Internal Medicine

## 2018-12-02 ENCOUNTER — Other Ambulatory Visit: Payer: Self-pay | Admitting: Internal Medicine

## 2018-12-02 DIAGNOSIS — H539 Unspecified visual disturbance: Secondary | ICD-10-CM

## 2018-12-04 DIAGNOSIS — I7300 Raynaud's syndrome without gangrene: Secondary | ICD-10-CM | POA: Diagnosis not present

## 2018-12-04 DIAGNOSIS — M810 Age-related osteoporosis without current pathological fracture: Secondary | ICD-10-CM | POA: Diagnosis not present

## 2018-12-04 DIAGNOSIS — E782 Mixed hyperlipidemia: Secondary | ICD-10-CM | POA: Diagnosis not present

## 2018-12-04 DIAGNOSIS — G3184 Mild cognitive impairment, so stated: Secondary | ICD-10-CM | POA: Diagnosis not present

## 2018-12-04 DIAGNOSIS — K219 Gastro-esophageal reflux disease without esophagitis: Secondary | ICD-10-CM | POA: Diagnosis not present

## 2018-12-04 DIAGNOSIS — I10 Essential (primary) hypertension: Secondary | ICD-10-CM | POA: Diagnosis not present

## 2018-12-04 DIAGNOSIS — R7301 Impaired fasting glucose: Secondary | ICD-10-CM | POA: Diagnosis not present

## 2018-12-04 DIAGNOSIS — R69 Illness, unspecified: Secondary | ICD-10-CM | POA: Diagnosis not present

## 2018-12-09 DIAGNOSIS — G3184 Mild cognitive impairment, so stated: Secondary | ICD-10-CM | POA: Diagnosis not present

## 2018-12-09 DIAGNOSIS — I7300 Raynaud's syndrome without gangrene: Secondary | ICD-10-CM | POA: Diagnosis not present

## 2018-12-09 DIAGNOSIS — R69 Illness, unspecified: Secondary | ICD-10-CM | POA: Diagnosis not present

## 2018-12-09 DIAGNOSIS — I10 Essential (primary) hypertension: Secondary | ICD-10-CM | POA: Diagnosis not present

## 2018-12-09 DIAGNOSIS — E782 Mixed hyperlipidemia: Secondary | ICD-10-CM | POA: Diagnosis not present

## 2018-12-09 DIAGNOSIS — M810 Age-related osteoporosis without current pathological fracture: Secondary | ICD-10-CM | POA: Diagnosis not present

## 2018-12-09 DIAGNOSIS — R7301 Impaired fasting glucose: Secondary | ICD-10-CM | POA: Diagnosis not present

## 2018-12-09 DIAGNOSIS — K219 Gastro-esophageal reflux disease without esophagitis: Secondary | ICD-10-CM | POA: Diagnosis not present

## 2018-12-17 ENCOUNTER — Ambulatory Visit (HOSPITAL_COMMUNITY): Payer: Medicare HMO

## 2018-12-21 DIAGNOSIS — Z6825 Body mass index (BMI) 25.0-25.9, adult: Secondary | ICD-10-CM | POA: Diagnosis not present

## 2018-12-21 DIAGNOSIS — M2550 Pain in unspecified joint: Secondary | ICD-10-CM | POA: Diagnosis not present

## 2018-12-21 DIAGNOSIS — I7300 Raynaud's syndrome without gangrene: Secondary | ICD-10-CM | POA: Diagnosis not present

## 2018-12-21 DIAGNOSIS — R5383 Other fatigue: Secondary | ICD-10-CM | POA: Diagnosis not present

## 2018-12-21 DIAGNOSIS — R768 Other specified abnormal immunological findings in serum: Secondary | ICD-10-CM | POA: Diagnosis not present

## 2018-12-21 DIAGNOSIS — E663 Overweight: Secondary | ICD-10-CM | POA: Diagnosis not present

## 2019-01-01 ENCOUNTER — Ambulatory Visit (HOSPITAL_COMMUNITY): Payer: Medicare HMO

## 2019-01-01 ENCOUNTER — Encounter (HOSPITAL_COMMUNITY): Payer: Self-pay

## 2019-01-18 ENCOUNTER — Encounter: Payer: Self-pay | Admitting: *Deleted

## 2019-01-19 ENCOUNTER — Encounter: Payer: Self-pay | Admitting: Diagnostic Neuroimaging

## 2019-01-19 ENCOUNTER — Other Ambulatory Visit: Payer: Self-pay

## 2019-01-19 ENCOUNTER — Ambulatory Visit: Payer: Medicare HMO | Admitting: Diagnostic Neuroimaging

## 2019-01-19 VITALS — BP 132/80 | HR 78 | Temp 98.6°F | Ht 60.0 in | Wt 128.8 lb

## 2019-01-19 DIAGNOSIS — R413 Other amnesia: Secondary | ICD-10-CM | POA: Diagnosis not present

## 2019-01-19 DIAGNOSIS — R5383 Other fatigue: Secondary | ICD-10-CM

## 2019-01-19 NOTE — Patient Instructions (Signed)
  MEMORY LOSS / FATIGUE / DIZZINESS  - check MRI brain  - optimize nutrition, exercise, depression/anxiety tx, sleep

## 2019-01-19 NOTE — Progress Notes (Signed)
[facility]  PATIENT: Jenna Booth DOB: 1945/07/13  REFERRING CLINICIAN:  Z Hall HISTORY FROM: patient  REASON FOR VISIT: new consult    HISTORICAL  CHIEF COMPLAINT:  Chief Complaint  Patient presents with  . Dizziness    rm 7, New Pt MMSE 24  . Memory Loss  . Tremor    HISTORY OF PRESENT ILLNESS:   73 year old female here for evaluation of balance difficulty, fatigue, memory loss, tremor, shortness of breath.  For past 1 month patient has had onset of intermittent tremors, shortness of breath, gait and balance difficulty, memory loss.  Patient is retired, widowed and lives alone.  She has some issues with depression but does not have any treatment for this.  Patient has been diagnosed with "cognitive impairment" by outside neurologist, recommended to continue conservative management.  Also has history of left hemisphere subdural hematoma, with midline shift and mass-effect, requiring surgical evacuation.  This happened in 2014.      REVIEW OF SYSTEMS: Full 14 system review of systems performed and negative with exception of: Blurred vision fatigue memory loss weakness dizziness passing out depression anxiety pain.  ALLERGIES: No Known Allergies  HOME MEDICATIONS: Outpatient Medications Prior to Visit  Medication Sig Dispense Refill  . acetaminophen (TYLENOL) 500 MG tablet Take 500 mg by mouth 2 (two) times daily.    . Alpha-Lipoic Acid 600 MG CAPS Take 600 mg by mouth daily.    . Cholecalciferol (VITAMIN D3 PO) Take 15,000 Units by mouth daily.    . Docosahexaenoic Acid (DHA PO) Take 1,000 mg by mouth daily.    Marland Kitchen glucosamine-chondroitin 500-400 MG tablet Take 1 tablet by mouth 2 (two) times daily.    Marland Kitchen loratadine (CLARITIN) 10 MG tablet Take 10 mg by mouth daily.    . Magnesium 250 MG TABS Take 1 tablet by mouth daily.    . meloxicam (MOBIC) 7.5 MG tablet Take 7.5 mg by mouth 2 (two) times daily.     . Menaquinone-7 (VITAMIN K2) 100 MCG CAPS  Take 100 mcg by mouth daily.    . Methylsulfonylmethane (MSM) 1000 MG CAPS Take 1 capsule by mouth daily.    . Multiple Vitamin (MULTIVITAMIN WITH MINERALS) TABS Take 1 tablet by mouth daily.    . Probiotic Product (PROBIOTIC DAILY PO) Take 1 tablet by mouth daily.    . traMADol (ULTRAM) 50 MG tablet Take 50 mg by mouth as needed.    Marland Kitchen UNABLE TO FIND Med Name: tumeric 350 mg 2 x daily    . UNABLE TO FIND Med Name: boswellia serrata 2 daily    . UNABLE TO FIND Med Name: MCT oil, 1 tea daily    . omeprazole (PRILOSEC) 20 MG capsule Take 1 capsule by mouth daily.    . polyethylene glycol (MIRALAX / GLYCOLAX) packet Take 17 g by mouth daily.     No facility-administered medications prior to visit.    PAST MEDICAL HISTORY: Past Medical History:  Diagnosis Date  . Acid reflux   . Anxiety   . Cognitive impairment   . Depression   . Dizzy spells   . GERD (gastroesophageal reflux disease)   . Hyperlipidemia   . Hypertension   . Osteoarthritis   . Osteoporosis   . Raynaud's disease without gangrene     PAST SURGICAL HISTORY: Past Surgical History:  Procedure Laterality Date  . CATARACT EXTRACTION W/PHACO Left 10/12/2012   Procedure: CATARACT EXTRACTION PHACO AND INTRAOCULAR LENS PLACEMENT (IOC);  Surgeon: Levada Dy  Geoffry Paradise, MD;  Location: [facility];  Service: Ophthalmology;  Laterality: Left;  CDE:16.97  . CRANIOTOMY Left 06/09/2012   Procedure: CRANIOTOMY HEMATOMA EVACUATION SUBDURAL;  Surgeon: Floyce Stakes, MD;  Location: [facility];  Service: Neurosurgery;  Laterality: Left;  . RHINOPLASTY      FAMILY HISTORY: Family History  Problem Relation Age of Onset  . Ulcerative colitis Mother   . Thyroid disease Mother        goiter  . Lung cancer Father   . Cardiomyopathy Sister        takotsubu  . Hyperlipidemia Sister   . Hypertension Sister   . Rheum arthritis Sister   . Thyroid disease Maternal Grandfather        died during operation for a goiter  . Other Paternal Grandmother         flu epidemic  . Other Paternal Grandfather        flu epidemic    SOCIAL HISTORY: Social History   Socioeconomic History  . Marital status: Widowed    Spouse name: Not on file  . Number of children: 0  . Years of education: Not on file  . Highest education level: Some college, no degree  Occupational History    Comment: retired  Tobacco Use  . Smoking status: Never Smoker  . Smokeless tobacco: Never Used  Substance and Sexual Activity  . Alcohol use: Never  . Drug use: Never  . Sexual activity: Not on file  Other Topics Concern  . Not on file  Social History Narrative   01/18/19 lives alone   Caffeine- coffee 1 c daily   Social Determinants of Health   Financial Resource Strain:   . Difficulty of Paying Living Expenses: Not on file  Food Insecurity:   . Worried About Charity fundraiser in the Last Year: Not on file  . Ran Out of Food in the Last Year: Not on file  Transportation Needs:   . Lack of Transportation (Medical): Not on file  . Lack of Transportation (Non-Medical): Not on file  Physical Activity:   . Days of Exercise per Week: Not on file  . Minutes of Exercise per Session: Not on file  Stress:   . Feeling of Stress : Not on file  Social Connections:   . Frequency of Communication with Friends and Family: Not on file  . Frequency of Social Gatherings with Friends and Family: Not on file  . Attends Religious Services: Not on file  . Active Member of Clubs or Organizations: Not on file  . Attends Archivist Meetings: Not on file  . Marital Status: Not on file  Intimate Partner Violence:   . Fear of Current or Ex-Partner: Not on file  . Emotionally Abused: Not on file  . Physically Abused: Not on file  . Sexually Abused: Not on file     PHYSICAL EXAM  GENERAL EXAM/CONSTITUTIONAL: Vitals:  Vitals:   01/19/19 1513  BP: 132/80  Pulse: 78  Temp: 98.6 F (37 C)  Weight: 128 lb 12.8 oz (58.4 kg)  Height: 5' (1.524 m)     Body  mass index is 25.15 kg/m. Wt Readings from Last 3 Encounters:  01/19/19 128 lb 12.8 oz (58.4 kg)  10/10/17 126 lb 9.6 oz (57.4 kg)  04/10/17 130 lb (59 kg)     Patient is in no distress; well developed, nourished and groomed; neck is supple  CARDIOVASCULAR:  Examination of carotid arteries is normal; no carotid  bruits  Regular rate and rhythm, no murmurs  Examination of peripheral vascular system by observation and palpation is normal  EYES:  Ophthalmoscopic exam of optic discs and posterior segments is normal; no papilledema or hemorrhages  No exam data present  MUSCULOSKELETAL:  Gait, strength, tone, movements noted in Neurologic exam below  NEUROLOGIC: MENTAL STATUS:  MMSE - Mini Mental State Exam 01/19/2019  Orientation to time 5  Orientation to Place 4  Registration 3  Attention/ Calculation 1  Recall 2  Language- name 2 objects 2  Language- repeat 1  Language- follow 3 step command 3  Language- read & follow direction 1  Write a sentence 1  Copy design 1  Total score 24    awake, alert, oriented to person, place and time  recent and remote memory intact  normal attention and concentration  language fluent, comprehension intact, naming intact  fund of knowledge appropriate  CRANIAL NERVE:   2nd - no papilledema on fundoscopic exam  2nd, 3rd, 4th, 6th - pupils equal and reactive to light, visual fields full to confrontation, extraocular muscles intact, no nystagmus  5th - facial sensation symmetric  7th - facial strength symmetric  8th - hearing intact  9th - palate elevates symmetrically, uvula midline  11th - shoulder shrug symmetric  12th - tongue protrusion midline  MOTOR:   normal bulk and tone, full strength in the BUE, BLE  SENSORY:   normal and symmetric to light touch, temperature, vibration; DECR IN LEFT FOOT  COORDINATION:   finger-nose-finger, fine finger movements normal  REFLEXES:   deep tendon reflexes present  and symmetric  GAIT/STATION:   narrow based gait; STOOPED POSTURE; SLIGHTLY UNSTEADY, ESP ON TURNING     DIAGNOSTIC DATA (LABS, IMAGING, TESTING) - I reviewed patient records, labs, notes, testing and imaging myself where available.  Lab Results  Component Value Date   WBC 11.2 (H) 01/24/2017   HGB 12.5 01/24/2017   HCT 38.9 01/24/2017   MCV 94.2 01/24/2017   PLT 172 01/24/2017      Component Value Date/Time   NA 142 01/24/2017 1807   K 4.1 01/24/2017 1807   CL 105 01/24/2017 1807   CO2 24 01/24/2017 1807   GLUCOSE 190 (H) 01/24/2017 1807   BUN 34 (H) 01/24/2017 1807   CREATININE 0.91 01/24/2017 1807   CALCIUM 10.2 01/24/2017 1807   PROT 7.4 01/24/2017 1807   ALBUMIN 4.8 01/24/2017 1807   AST 28 01/24/2017 1807   ALT 19 01/24/2017 1807   ALKPHOS 67 01/24/2017 1807   BILITOT 0.5 01/24/2017 1807   GFRNONAA >60 01/24/2017 1807   GFRAA >60 01/24/2017 1807   No results found for: CHOL, HDL, LDLCALC, LDLDIRECT, TRIG, CHOLHDL No results found for: HGBA1C No results found for: VITAMINB12 Lab Results  Component Value Date   TSH 2.630 04/10/2017    01/24/17 CT head [I reviewed images myself and agree with interpretation. -VRP]  1. No acute intracranial pathology. 2. Fractures of the anterior and posterolateral walls of the left maxillary antrum with small left maxillary hemosinus.   ASSESSMENT AND PLAN  73 y.o. year old female here with new onset tremor, memory loss, dizziness, gait and balance difficulty in November 2020.  We will proceed with further work-up.  Dx:  1. Memory loss   2. Other fatigue     PLAN:  MEMORY LOSS / FATIGUE / DIZZINESS - check MRI brain - optimize nutrition, exercise, depression/anxiety tx, sleep - caution with gait and balance; use cane;  consider PT evaluation  Orders Placed This Encounter  Procedures  . MR BRAIN W WO CONTRAST   Return for pending if symptoms worsen or fail to improve.    Penni Bombard, MD [id number],  [id number] PM Certified in Neurology, Neurophysiology and Neuroimaging  [facility] [address], [place], [place] [address] [phone]

## 2019-01-21 ENCOUNTER — Emergency Department (HOSPITAL_COMMUNITY): Payer: Medicare HMO

## 2019-01-21 ENCOUNTER — Emergency Department (HOSPITAL_COMMUNITY)
Admission: EM | Admit: 2019-01-21 | Discharge: 2019-01-21 | Disposition: A | Discharge status: Home / Self Care | Payer: Medicare HMO | Attending: Emergency Medicine | Admitting: Emergency Medicine

## 2019-01-21 ENCOUNTER — Encounter (HOSPITAL_COMMUNITY): Payer: Self-pay | Admitting: *Deleted

## 2019-01-21 ENCOUNTER — Other Ambulatory Visit: Payer: Self-pay

## 2019-01-21 DIAGNOSIS — Y999 Unspecified external cause status: Secondary | ICD-10-CM | POA: Insufficient documentation

## 2019-01-21 DIAGNOSIS — Z79899 Other long term (current) drug therapy: Secondary | ICD-10-CM | POA: Diagnosis not present

## 2019-01-21 DIAGNOSIS — S0181XA Laceration without foreign body of other part of head, initial encounter: Secondary | ICD-10-CM

## 2019-01-21 DIAGNOSIS — S022XXA Fracture of nasal bones, initial encounter for closed fracture: Secondary | ICD-10-CM | POA: Insufficient documentation

## 2019-01-21 DIAGNOSIS — Z23 Encounter for immunization: Secondary | ICD-10-CM | POA: Diagnosis not present

## 2019-01-21 DIAGNOSIS — I10 Essential (primary) hypertension: Secondary | ICD-10-CM | POA: Insufficient documentation

## 2019-01-21 DIAGNOSIS — W010XXA Fall on same level from slipping, tripping and stumbling without subsequent striking against object, initial encounter: Secondary | ICD-10-CM | POA: Diagnosis not present

## 2019-01-21 DIAGNOSIS — S0121XA Laceration without foreign body of nose, initial encounter: Secondary | ICD-10-CM | POA: Diagnosis not present

## 2019-01-21 DIAGNOSIS — S0101XA Laceration without foreign body of scalp, initial encounter: Secondary | ICD-10-CM | POA: Diagnosis not present

## 2019-01-21 DIAGNOSIS — Y939 Activity, unspecified: Secondary | ICD-10-CM | POA: Insufficient documentation

## 2019-01-21 DIAGNOSIS — Y92014 Private driveway to single-family (private) house as the place of occurrence of the external cause: Secondary | ICD-10-CM | POA: Diagnosis not present

## 2019-01-21 DIAGNOSIS — W19XXXA Unspecified fall, initial encounter: Secondary | ICD-10-CM

## 2019-01-21 MED ORDER — TETANUS-DIPHTH-ACELL PERTUSSIS 5-2.5-18.5 LF-MCG/0.5 IM SUSP
0.5000 mL | Freq: Once | INTRAMUSCULAR | Status: AC
  Administered 2019-01-21: 0.5 mL via INTRAMUSCULAR
  Filled 2019-01-21: qty 0.5

## 2019-01-21 MED ORDER — HYDROCODONE-ACETAMINOPHEN 5-325 MG PO TABS: 1.0000 | ORAL_TABLET | ORAL | 0 refills | Status: DC | PRN

## 2019-01-21 NOTE — Discharge Instructions (Signed)
Take Norco as needed for severe pain Take Tylenol for mild-moderate pain You can ice the nose for 20 minutes at a time until swelling improves Avoid lying flat or blowing your nose Please follow up with your doctor

## 2019-01-21 NOTE — ED Notes (Signed)
Patient transported to X-ray 

## 2019-01-21 NOTE — ED Provider Notes (Signed)
[facility] EMERGENCY DEPARTMENT Provider Note   CSN: [id number] Arrival date & time: 01/21/19  1330     History Chief Complaint  Patient presents with  . Facial Laceration  . Fall    Jenna Booth is a 73 y.o. female who presents with a fall and facial laceration.  Patient states she was taking out the trash this morning and when she turned she missed a step, tripped, fell onto cement.  She fell with outstretched hands to try and catch her fall but she still hit her face on the cement.  She had immediate gushing of blood from her face and her nose.  She was able to crawl through the grass and able to get up from there.  She called family member and asked them to take her to the ED.  She denies headache, dizziness, loss of consciousness, arm or leg weakness, neck pain.  She is having pain mostly over the bridge of her nose.  She is not on any blood thinners.  She is seeing a neurologist for issues with her balance and chronic fatigue however she states that that was not a factor today and it was purely a mechanical fall  HPI     Past Medical History:  Diagnosis Date  . Acid reflux   . Anxiety   . Cognitive impairment   . Depression   . Dizzy spells   . GERD (gastroesophageal reflux disease)   . Hyperlipidemia   . Hypertension   . Osteoarthritis   . Osteoporosis   . Raynaud's disease without gangrene     There are no problems to display for this patient.   Past Surgical History:  Procedure Laterality Date  . CATARACT EXTRACTION W/PHACO Left 10/12/2012   Procedure: CATARACT EXTRACTION PHACO AND INTRAOCULAR LENS PLACEMENT (IOC);  Surgeon: Tonny Branch, MD;  Location: [facility] ORS;  Service: Ophthalmology;  Laterality: Left;  CDE:16.97  . CRANIOTOMY Left 06/09/2012   Procedure: CRANIOTOMY HEMATOMA EVACUATION SUBDURAL;  Surgeon: Floyce Stakes, MD;  Location: [facility];  Service: Neurosurgery;  Laterality: Left;  . RHINOPLASTY       OB History   No obstetric history on  file.     Family History  Problem Relation Age of Onset  . Ulcerative colitis Mother   . Thyroid disease Mother        goiter  . Lung cancer Father   . Cardiomyopathy Sister        takotsubu  . Hyperlipidemia Sister   . Hypertension Sister   . Rheum arthritis Sister   . Thyroid disease Maternal Grandfather        died during operation for a goiter  . Other Paternal Grandmother        flu epidemic  . Other Paternal Grandfather        flu epidemic    Social History   Tobacco Use  . Smoking status: Never Smoker  . Smokeless tobacco: Never Used  Substance Use Topics  . Alcohol use: Never  . Drug use: Never    Home Medications Prior to Admission medications   Medication Sig Start Date End Date Taking? Authorizing Provider  acetaminophen (TYLENOL) 500 MG tablet Take 500 mg by mouth 2 (two) times daily.    [provider]  Alpha-Lipoic Acid 600 MG CAPS Take 600 mg by mouth daily.    [provider]  Cholecalciferol (VITAMIN D3 PO) Take 15,000 Units by mouth daily.    [provider]  Docosahexaenoic  Acid (DHA PO) Take 1,000 mg by mouth daily.    [provider]  glucosamine-chondroitin 500-400 MG tablet Take 1 tablet by mouth 2 (two) times daily.    [provider]  loratadine (CLARITIN) 10 MG tablet Take 10 mg by mouth daily.    [provider]  Magnesium 250 MG TABS Take 1 tablet by mouth daily.    [provider]  meloxicam (MOBIC) 7.5 MG tablet Take 7.5 mg by mouth 2 (two) times daily.     [provider]  Menaquinone-7 (VITAMIN K2) 100 MCG CAPS Take 100 mcg by mouth daily.    [provider]  Methylsulfonylmethane (MSM) 1000 MG CAPS Take 1 capsule by mouth daily.    [provider]  Multiple Vitamin (MULTIVITAMIN WITH MINERALS) TABS Take 1 tablet by mouth daily.    [provider]  Probiotic Product (PROBIOTIC DAILY PO) Take 1 tablet by mouth daily.    [provider]  traMADol (ULTRAM) 50 MG tablet Take 50 mg by mouth as needed.    [provider]  UNABLE TO FIND Med Name: tumeric 350 mg 2 x daily    [provider]  UNABLE TO FIND Med Name: boswellia serrata 2 daily    [provider]  UNABLE TO FIND Med Name: MCT oil, 1 tea daily    [provider]    Allergies    Patient has no known allergies.  Review of Systems   Review of Systems  HENT: Positive for nosebleeds.        +pain over nose  Musculoskeletal: Positive for gait problem (balance issues).  Skin: Positive for wound.  Neurological: Negative for dizziness, syncope, weakness, numbness and headaches.  Hematological: Does not bruise/bleed easily.  All other systems reviewed and are negative.   Physical Exam Updated Vital Signs BP 95/84 (BP Location: Right Arm)   Pulse 85   Temp 97.9 F (36.6 C) (Oral)   Resp 16   Ht 5' (1.524 m)   SpO2 100%   BMI 25.15 kg/m   Physical Exam Vitals and nursing note reviewed.  Constitutional:      General: She is not in acute distress.    Appearance: Normal appearance. She is well-developed. She is not ill-appearing.  HENT:     Head: Normocephalic.     Comments: ~4cm Jagged laceration over the right forehead  1cm linear laceration over the left forehead    Nose: Nasal deformity and nasal tenderness present.     Right Nostril: Epistaxis present.     Left Nostril: Epistaxis present. No septal hematoma.     Comments: Dried blood in the nares. No active bleeding or septal hematoma Eyes:     General: No scleral icterus.       Right eye: No discharge.        Left eye: No discharge.     Conjunctiva/sclera: Conjunctivae normal.     Pupils: Pupils are equal, round, and reactive to light.  Neck:     Comments: No neck tenderness Cardiovascular:     Rate and Rhythm: Normal rate and regular rhythm.  Pulmonary:     Effort: Pulmonary effort is normal. No respiratory distress.     Breath sounds: Normal breath  sounds.  Abdominal:     General: There is no distension.  Musculoskeletal:     Cervical back: Normal range of motion.     Comments: Moves all extremities. Ambulatory  Skin:    General: Skin is  warm and dry.  Neurological:     Mental Status: She is alert and oriented to person, place, and time.  Psychiatric:        Behavior: Behavior normal.     ED Results / Procedures / Treatments   Labs (all labs ordered are listed, but only abnormal results are displayed) Labs Reviewed - No data to display  EKG None  Radiology CT Head Wo Contrast  Result Date: 01/21/2019 CLINICAL DATA:  Fall. EXAM: CT HEAD WITHOUT CONTRAST CT MAXILLOFACIAL WITHOUT CONTRAST TECHNIQUE: Multidetector CT imaging of the head and maxillofacial structures were performed using the standard protocol without intravenous contrast. Multiplanar CT image reconstructions of the maxillofacial structures were also generated. COMPARISON:  CT head dated January 24, 2017. FINDINGS: CT HEAD FINDINGS Brain: No evidence of acute infarction, hemorrhage, hydrocephalus, extra-axial collection or mass lesion/mass effect. Stable mild atrophy and chronic microvascular ischemic changes. Vascular: No hyperdense vessel or unexpected calcification. Skull: Prior left frontoparietal craniotomy. Negative for acute fracture or focal lesion. Other: Small frontal scalp laceration and hematoma. CT MAXILLOFACIAL FINDINGS Osseous: Minimally depressed fracture of the left nasal bone. No additional fracture. Orbits: Negative. No traumatic or inflammatory finding. Sinuses: Retention cyst in a left posterior ethmoid air cell. Small air-fluid level in the right sphenoid sinus. Soft tissues: Small hematoma over the nasal bridge. IMPRESSION: 1. No acute intracranial abnormality. Small frontal scalp laceration and hematoma. 2. Minimally depressed fracture of the left nasal bone. Small hematoma over the nasal bridge. 3. Small air-fluid level in the right sphenoid sinus.  Correlate for sinusitis. Electronically Signed   By: Titus Dubin M.D.   On: 01/21/2019 17:09   CT Maxillofacial WO CM  Result Date: 01/21/2019 CLINICAL DATA:  Fall. EXAM: CT HEAD WITHOUT CONTRAST CT MAXILLOFACIAL WITHOUT CONTRAST TECHNIQUE: Multidetector CT imaging of the head and maxillofacial structures were performed using the standard protocol without intravenous contrast. Multiplanar CT image reconstructions of the maxillofacial structures were also generated. COMPARISON:  CT head dated January 24, 2017. FINDINGS: CT HEAD FINDINGS Brain: No evidence of acute infarction, hemorrhage, hydrocephalus, extra-axial collection or mass lesion/mass effect. Stable mild atrophy and chronic microvascular ischemic changes. Vascular: No hyperdense vessel or unexpected calcification. Skull: Prior left frontoparietal craniotomy. Negative for acute fracture or focal lesion. Other: Small frontal scalp laceration and hematoma. CT MAXILLOFACIAL FINDINGS Osseous: Minimally depressed fracture of the left nasal bone. No additional fracture. Orbits: Negative. No traumatic or inflammatory finding. Sinuses: Retention cyst in a left posterior ethmoid air cell. Small air-fluid level in the right sphenoid sinus. Soft tissues: Small hematoma over the nasal bridge. IMPRESSION: 1. No acute intracranial abnormality. Small frontal scalp laceration and hematoma. 2. Minimally depressed fracture of the left nasal bone. Small hematoma over the nasal bridge. 3. Small air-fluid level in the right sphenoid sinus. Correlate for sinusitis. Electronically Signed   By: Titus Dubin M.D.   On: 01/21/2019 17:09    Procedures .Marland KitchenLaceration Repair  Date/Time: 01/21/2019 9:19 PM Performed by: Recardo Evangelist, PA-C Authorized by: Recardo Evangelist, PA-C   Consent:    Consent obtained:  Verbal   Consent given by:  Patient   Risks discussed:  Infection, pain and poor cosmetic result   Alternatives discussed:  No treatment Anesthesia  (see MAR for exact dosages):    Anesthesia method:  None Laceration details:    Location:  Face   Face location:  Forehead   Length (cm):  4   Depth (mm):  3 Repair type:    Repair  type:  Simple Pre-procedure details:    Preparation:  Patient was prepped and draped in usual sterile fashion Exploration:    Wound exploration: wound explored through full range of motion and entire depth of wound probed and visualized     Wound extent: no underlying fracture noted   Treatment:    Area cleansed with:  Shur-Clens   Amount of cleaning:  Standard   Irrigation method:  Tap   Visualized foreign bodies/material removed: no   Skin repair:    Repair method:  Tissue adhesive Approximation:    Approximation:  Close Post-procedure details:    Patient tolerance of procedure:  Tolerated well, no immediate complications .Marland KitchenLaceration Repair  Date/Time: 01/21/2019 9:20 PM Performed by: Recardo Evangelist, PA-C Authorized by: Recardo Evangelist, PA-C   Consent:    Consent obtained:  Verbal   Consent given by:  Patient   Risks discussed:  Infection and pain   Alternatives discussed:  No treatment Laceration details:    Location:  Face   Face location:  Forehead   Length (cm):  1   Depth (mm):  3 Repair type:    Repair type:  Simple Pre-procedure details:    Preparation:  Patient was prepped and draped in usual sterile fashion Exploration:    Wound exploration: wound explored through full range of motion and entire depth of wound probed and visualized   Treatment:    Area cleansed with:  Shur-Clens   Amount of cleaning:  Standard   Irrigation method:  Tap   Visualized foreign bodies/material removed: no   Skin repair:    Repair method:  Tissue adhesive Approximation:    Approximation:  Close Post-procedure details:    Dressing:  Sterile dressing   Patient tolerance of procedure:  Tolerated well, no immediate complications   (including critical care time)    Medications Ordered  in ED Medications  Tdap (BOOSTRIX) injection 0.5 mL (0.5 mLs Intramuscular Given 01/21/19 1514)    ED Course  I have reviewed the triage vital signs and the nursing notes.  Pertinent labs & imaging results that were available during my care of the patient were reviewed by me and considered in my medical decision making (see chart for details).  73 year old female presents with a mechanical fall onto her face today with laceration and nosebleed.  Her vitals are reassuring.  She has a jagged appearing wound over the right side of her forehead and a smaller one over the left side.  Both of them are relatively superficial and I think would be able to come together nicely with Dermabond.  The wound was cleansed and Dermabond was applied with good results.  CT of the head and face were ordered due to significant swelling of the nose and her age.  CT head is negative.  CT of the face shows "Minimally depressed fracture of the left nasal bone". Advised to ice the area and to sleep upright and not blow her nose. She was given rx for pain medicine and advised to f/u with PCP.  MDM Rules/Calculators/A&P   Final Clinical Impression(s) / ED Diagnoses Final diagnoses:  Fall, initial encounter  Laceration of forehead, initial encounter  Closed fracture of nasal bone, initial encounter    Rx / DC Orders ED Discharge Orders    None       Recardo Evangelist, PA-C 01/21/19 2123    Margette Fast, MD 01/25/19 [phone]

## 2019-01-21 NOTE — ED Triage Notes (Signed)
Fell on cement at home. Facial lacerations  denies

## 2019-02-01 ENCOUNTER — Ambulatory Visit: Payer: Medicare HMO | Admitting: Diagnostic Neuroimaging

## 2019-02-01 DIAGNOSIS — R7301 Impaired fasting glucose: Secondary | ICD-10-CM | POA: Diagnosis not present

## 2019-02-01 DIAGNOSIS — M810 Age-related osteoporosis without current pathological fracture: Secondary | ICD-10-CM | POA: Diagnosis not present

## 2019-02-01 DIAGNOSIS — R69 Illness, unspecified: Secondary | ICD-10-CM | POA: Diagnosis not present

## 2019-02-01 DIAGNOSIS — E782 Mixed hyperlipidemia: Secondary | ICD-10-CM | POA: Diagnosis not present

## 2019-02-01 DIAGNOSIS — G3184 Mild cognitive impairment, so stated: Secondary | ICD-10-CM | POA: Diagnosis not present

## 2019-02-01 DIAGNOSIS — I10 Essential (primary) hypertension: Secondary | ICD-10-CM | POA: Diagnosis not present

## 2019-02-01 DIAGNOSIS — I7300 Raynaud's syndrome without gangrene: Secondary | ICD-10-CM | POA: Diagnosis not present

## 2019-02-01 DIAGNOSIS — K219 Gastro-esophageal reflux disease without esophagitis: Secondary | ICD-10-CM | POA: Diagnosis not present

## 2019-02-03 DIAGNOSIS — E782 Mixed hyperlipidemia: Secondary | ICD-10-CM | POA: Diagnosis not present

## 2019-02-03 DIAGNOSIS — R7303 Prediabetes: Secondary | ICD-10-CM | POA: Diagnosis not present

## 2019-02-03 DIAGNOSIS — I10 Essential (primary) hypertension: Secondary | ICD-10-CM | POA: Diagnosis not present

## 2019-02-03 DIAGNOSIS — R7301 Impaired fasting glucose: Secondary | ICD-10-CM | POA: Diagnosis not present

## 2019-02-08 DIAGNOSIS — R55 Syncope and collapse: Secondary | ICD-10-CM | POA: Diagnosis not present

## 2019-02-08 DIAGNOSIS — I10 Essential (primary) hypertension: Secondary | ICD-10-CM | POA: Diagnosis not present

## 2019-02-08 DIAGNOSIS — G3184 Mild cognitive impairment, so stated: Secondary | ICD-10-CM | POA: Diagnosis not present

## 2019-02-08 DIAGNOSIS — H9319 Tinnitus, unspecified ear: Secondary | ICD-10-CM | POA: Diagnosis not present

## 2019-02-08 DIAGNOSIS — R69 Illness, unspecified: Secondary | ICD-10-CM | POA: Diagnosis not present

## 2019-02-08 DIAGNOSIS — E6609 Other obesity due to excess calories: Secondary | ICD-10-CM | POA: Diagnosis not present

## 2019-02-08 DIAGNOSIS — M810 Age-related osteoporosis without current pathological fracture: Secondary | ICD-10-CM | POA: Diagnosis not present

## 2019-02-08 DIAGNOSIS — I7300 Raynaud's syndrome without gangrene: Secondary | ICD-10-CM | POA: Diagnosis not present

## 2019-02-08 DIAGNOSIS — E782 Mixed hyperlipidemia: Secondary | ICD-10-CM | POA: Diagnosis not present

## 2019-02-16 ENCOUNTER — Other Ambulatory Visit: Payer: Medicare HMO

## 2019-02-23 ENCOUNTER — Telehealth: Payer: Self-pay

## 2019-02-23 NOTE — Telephone Encounter (Signed)
MRI was denied per [employer] pre cert department. P2P will be required in order for patient to proceed. Information provided by GI;  [facility] [phone] with [phone]

## 2019-02-23 NOTE — Telephone Encounter (Signed)
Go ahead with appeal. I can come on the phone if needed. MRI brain to rule out stroke, mass or inflammation. -VRP

## 2019-02-23 NOTE — Telephone Encounter (Signed)
Called [employer], spoke with Candace B, case [id number] for MRI brain w/w/o. The  denial reason: insufficient information, must include results of detailed clinical evaluation , detailed physical exam, results of a detailed history relevant to ordering the MRI. She stated that the peer to peer will not get MRI approved. It must be appealed with [facility] appeals dept , phone [phone].

## 2019-02-24 NOTE — Telephone Encounter (Addendum)
Called [employer], spoke with Latoya to begin expedited appeal for MRI brain, w-w/o contrast. Decision in 72 hrs.  She advised to fax medical records related to appeal to [phone] within 24 hours.  Answered clinical questions.  Request conf # [id number]. Faxed 01/2019 office note to # provided, received confirmation.

## 2019-03-03 NOTE — Telephone Encounter (Signed)
MRI brain approved. She is scheduled at [facility] 03/23/19.

## 2019-03-23 ENCOUNTER — Ambulatory Visit
Admission: RE | Admit: 2019-03-23 | Discharge: 2019-03-23 | Disposition: A | Discharge status: Home / Self Care | Payer: Medicare HMO | Source: Ambulatory Visit | Attending: Diagnostic Neuroimaging | Admitting: Diagnostic Neuroimaging

## 2019-03-23 DIAGNOSIS — R413 Other amnesia: Secondary | ICD-10-CM

## 2019-03-28 DIAGNOSIS — E7849 Other hyperlipidemia: Secondary | ICD-10-CM | POA: Diagnosis not present

## 2019-03-28 DIAGNOSIS — R69 Illness, unspecified: Secondary | ICD-10-CM | POA: Diagnosis not present

## 2019-03-28 DIAGNOSIS — I7300 Raynaud's syndrome without gangrene: Secondary | ICD-10-CM | POA: Diagnosis not present

## 2019-03-28 DIAGNOSIS — R7301 Impaired fasting glucose: Secondary | ICD-10-CM | POA: Diagnosis not present

## 2019-03-28 DIAGNOSIS — E782 Mixed hyperlipidemia: Secondary | ICD-10-CM | POA: Diagnosis not present

## 2019-03-28 DIAGNOSIS — G3184 Mild cognitive impairment, so stated: Secondary | ICD-10-CM | POA: Diagnosis not present

## 2019-03-28 DIAGNOSIS — I10 Essential (primary) hypertension: Secondary | ICD-10-CM | POA: Diagnosis not present

## 2019-03-28 DIAGNOSIS — M810 Age-related osteoporosis without current pathological fracture: Secondary | ICD-10-CM | POA: Diagnosis not present

## 2019-05-19 IMAGING — US US CAROTID DUPLEX BILAT
1 series · 13 of 24 positions shown · non-contrast
Comparison: No prior duplex

CLINICAL DATA: 71-year-old female with a history of syncope.

Cardiovascular risk factors are none
EXAM:
BILATERAL CAROTID DUPLEX ULTRASOUND
TECHNIQUE: Gray scale imaging, color Doppler and duplex ultrasound were
performed of bilateral carotid and vertebral arteries in the neck.

[Series 1: us carotid duplex bilat · 0.05mm/px · 13 of 69 slices shown]
[im 1/69]
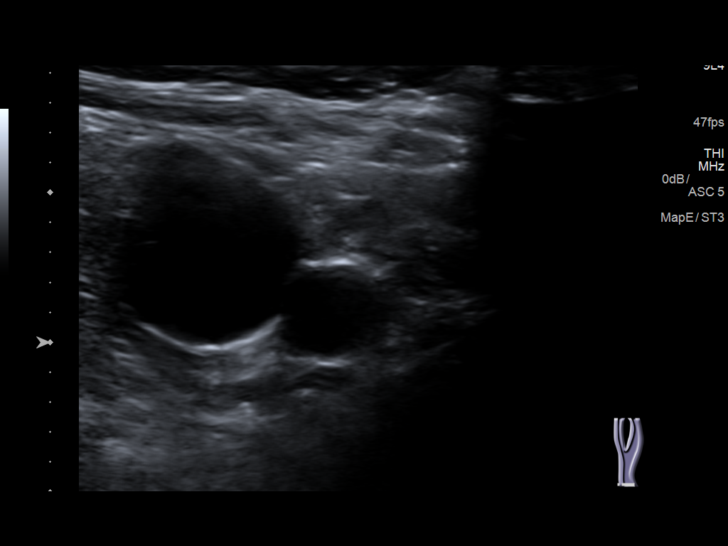
[im 6/69]
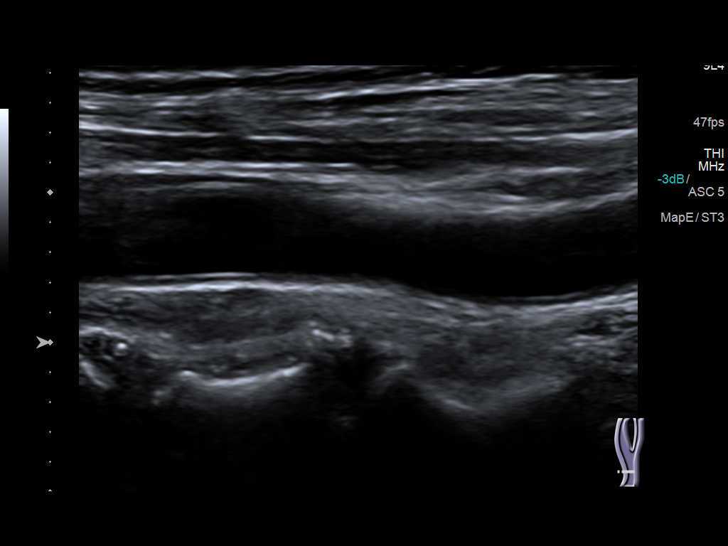
[im 12/69]
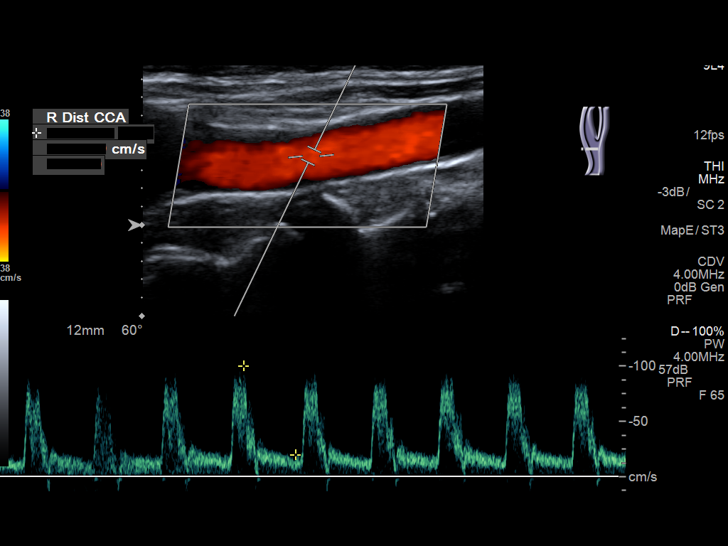
[im 18/69]
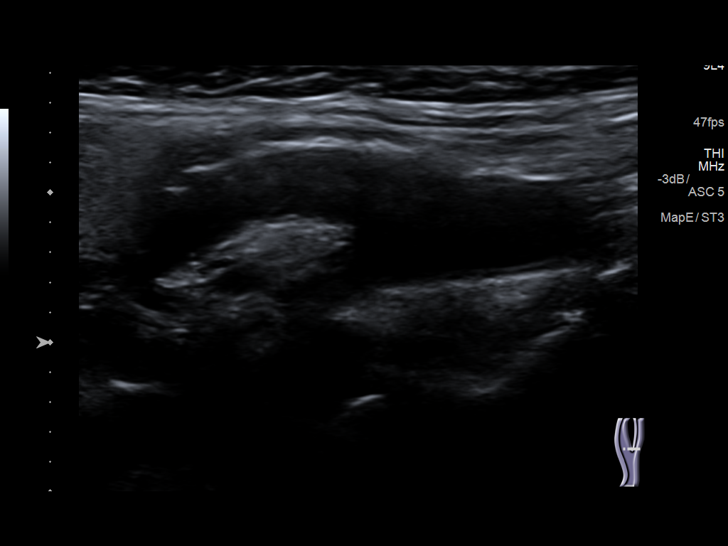
[im 24/69]
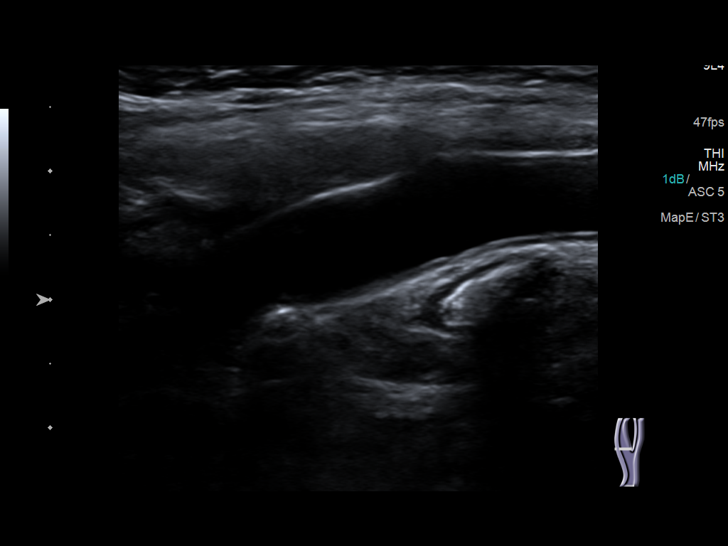
[im 30/69]
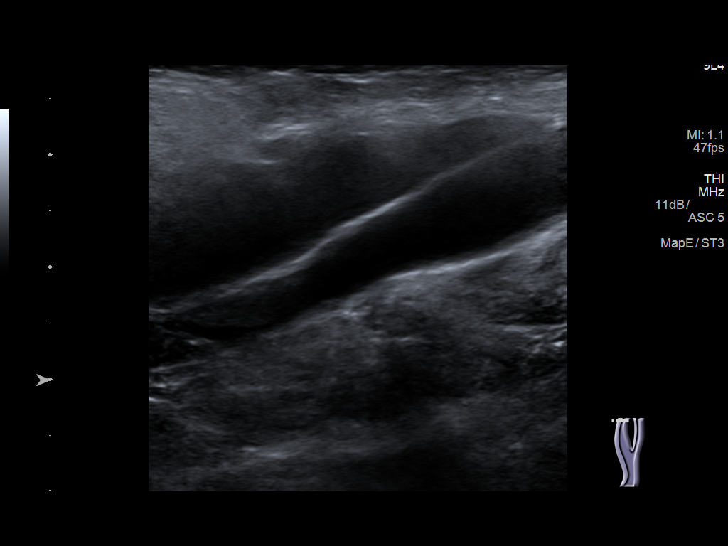
[im 36/69]
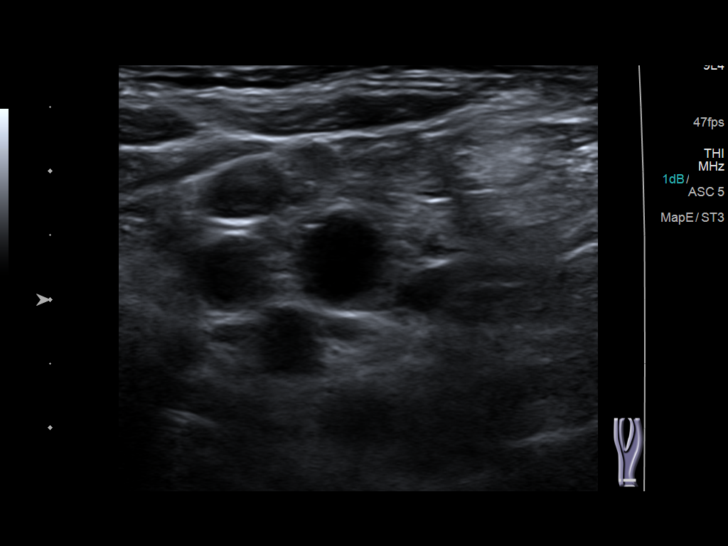
[im 39/69]
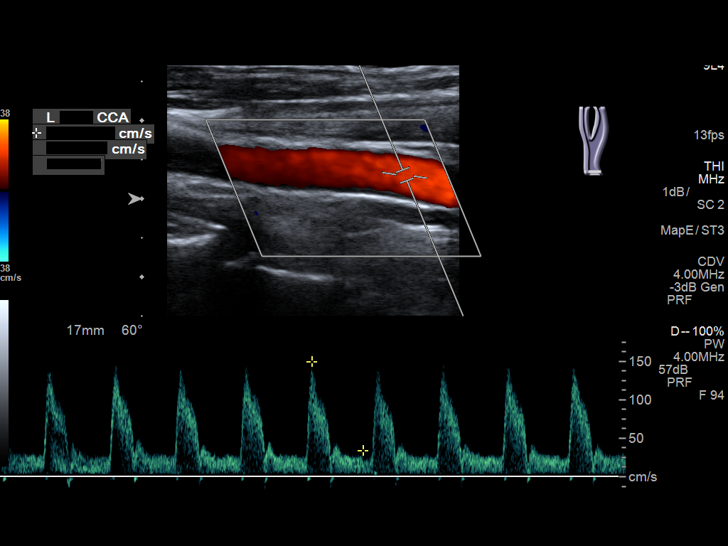
[im 45/69]
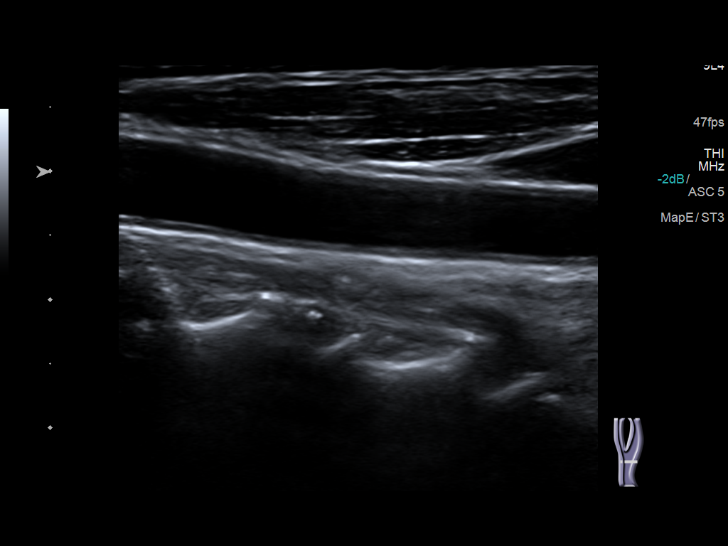
[im 51/69]
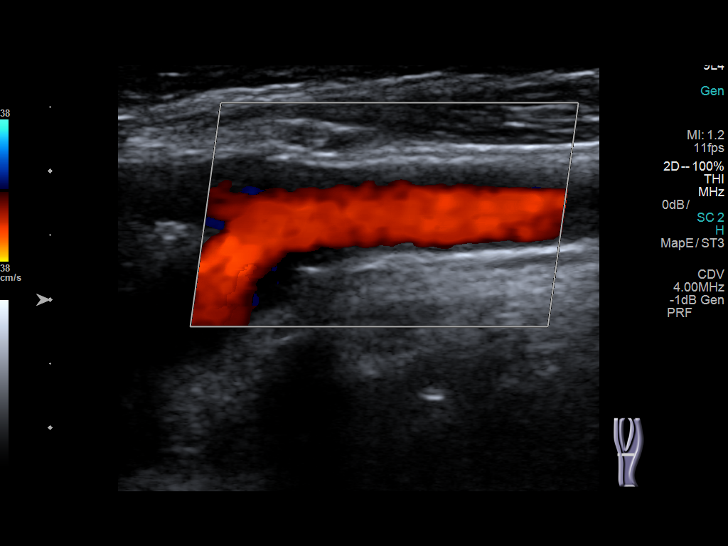
[im 57/69]
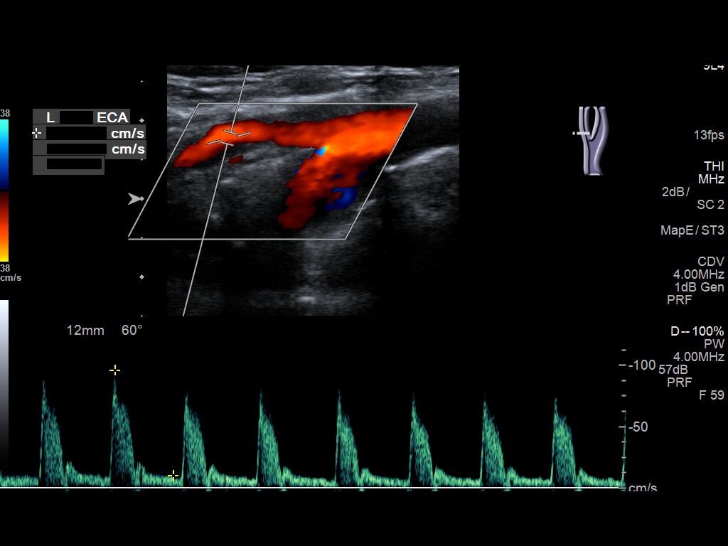
[im 63/69]
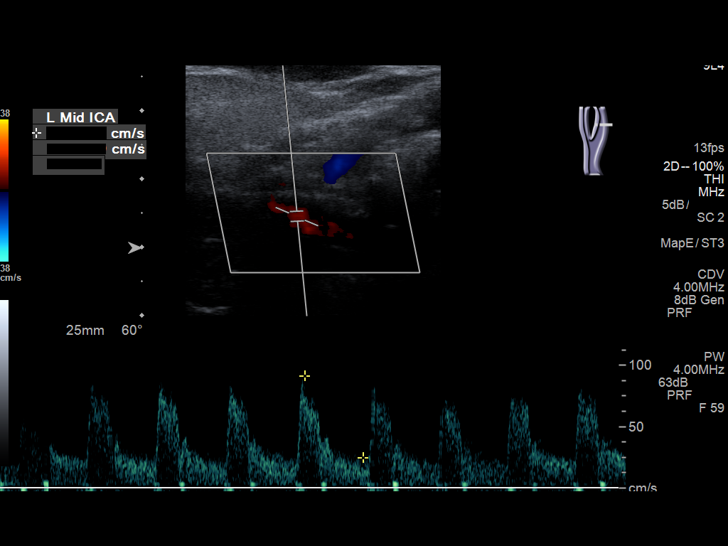
[im 69/69]
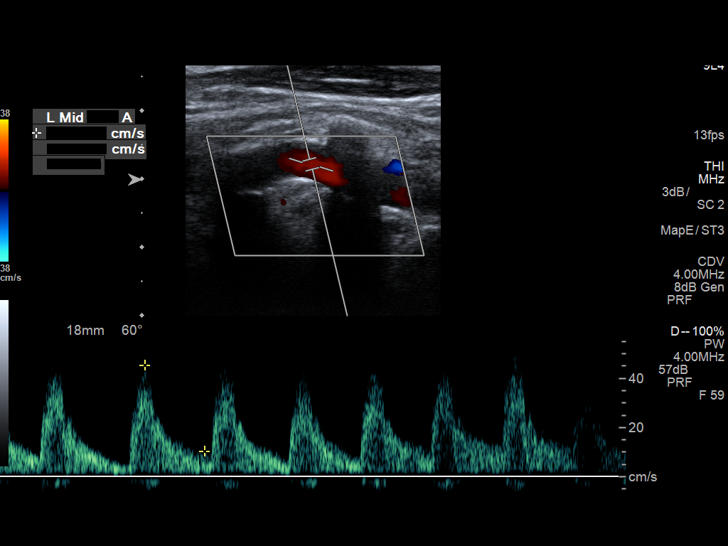

[13 of 24 positions shown; findings below may reference images not displayed]

FINDINGS: Criteria: Quantification of carotid stenosis is based on velocity
parameters that correlate the residual internal carotid diameter
with NASCET-based stenosis levels, using the diameter of the distal
internal carotid lumen as the denominator for stenosis measurement.

The following velocity measurements were obtained:

RIGHT

ICA:  Systolic 85 cm/sec, Diastolic 14 cm/sec

CCA:  104 cm/sec

SYSTOLIC ICA/CCA RATIO:

ECA:  72 cm/sec

LEFT

ICA:  Systolic 102 cm/sec, Diastolic 19 cm/sec

CCA:  111 cm/sec

SYSTOLIC ICA/CCA RATIO:

ECA:  96 cm/sec

Right Brachial SBP: Not acquired

Left Brachial SBP: Not acquired

RIGHT CAROTID ARTERY: No significant calcified disease of the right
common carotid artery. Intermediate waveform maintained.
Heterogeneous plaque without significant calcifications at the right
carotid bifurcation. Low resistance waveform of the right ICA. No
significant tortuosity.

RIGHT VERTEBRAL ARTERY: Antegrade flow with low resistance waveform.

LEFT CAROTID ARTERY: No significant calcified disease of the left
common carotid artery. Intermediate waveform maintained.
Heterogeneous plaque at the left carotid bifurcation without
significant calcifications. Low resistance waveform of the left ICA.

LEFT VERTEBRAL ARTERY:  Antegrade flow with low resistance waveform.
IMPRESSION: Color duplex indicates minimal heterogeneous plaque, with no
hemodynamically significant stenosis by duplex criteria in the
extracranial cerebrovascular circulation.

## 2019-05-21 DIAGNOSIS — M810 Age-related osteoporosis without current pathological fracture: Secondary | ICD-10-CM | POA: Diagnosis not present

## 2019-05-21 DIAGNOSIS — R7301 Impaired fasting glucose: Secondary | ICD-10-CM | POA: Diagnosis not present

## 2019-05-21 DIAGNOSIS — G3184 Mild cognitive impairment, so stated: Secondary | ICD-10-CM | POA: Diagnosis not present

## 2019-05-21 DIAGNOSIS — I7300 Raynaud's syndrome without gangrene: Secondary | ICD-10-CM | POA: Diagnosis not present

## 2019-05-21 DIAGNOSIS — E7849 Other hyperlipidemia: Secondary | ICD-10-CM | POA: Diagnosis not present

## 2019-05-21 DIAGNOSIS — R69 Illness, unspecified: Secondary | ICD-10-CM | POA: Diagnosis not present

## 2019-05-21 DIAGNOSIS — I10 Essential (primary) hypertension: Secondary | ICD-10-CM | POA: Diagnosis not present

## 2019-05-21 DIAGNOSIS — E782 Mixed hyperlipidemia: Secondary | ICD-10-CM | POA: Diagnosis not present

## 2019-08-11 DIAGNOSIS — R7303 Prediabetes: Secondary | ICD-10-CM | POA: Diagnosis not present

## 2019-08-11 DIAGNOSIS — H26499 Other secondary cataract, unspecified eye: Secondary | ICD-10-CM | POA: Diagnosis not present

## 2019-08-11 DIAGNOSIS — R55 Syncope and collapse: Secondary | ICD-10-CM | POA: Diagnosis not present

## 2019-08-11 DIAGNOSIS — H9319 Tinnitus, unspecified ear: Secondary | ICD-10-CM | POA: Diagnosis not present

## 2019-08-11 DIAGNOSIS — G3184 Mild cognitive impairment, so stated: Secondary | ICD-10-CM | POA: Diagnosis not present

## 2019-08-11 DIAGNOSIS — Z6826 Body mass index (BMI) 26.0-26.9, adult: Secondary | ICD-10-CM | POA: Diagnosis not present

## 2019-08-11 DIAGNOSIS — I7300 Raynaud's syndrome without gangrene: Secondary | ICD-10-CM | POA: Diagnosis not present

## 2019-08-11 DIAGNOSIS — Z23 Encounter for immunization: Secondary | ICD-10-CM | POA: Diagnosis not present

## 2019-08-11 DIAGNOSIS — R319 Hematuria, unspecified: Secondary | ICD-10-CM | POA: Diagnosis not present

## 2019-08-11 DIAGNOSIS — H9311 Tinnitus, right ear: Secondary | ICD-10-CM | POA: Diagnosis not present

## 2019-08-17 DIAGNOSIS — M810 Age-related osteoporosis without current pathological fracture: Secondary | ICD-10-CM | POA: Diagnosis not present

## 2019-08-17 DIAGNOSIS — G3184 Mild cognitive impairment, so stated: Secondary | ICD-10-CM | POA: Diagnosis not present

## 2019-08-17 DIAGNOSIS — I10 Essential (primary) hypertension: Secondary | ICD-10-CM | POA: Diagnosis not present

## 2019-08-17 DIAGNOSIS — I7300 Raynaud's syndrome without gangrene: Secondary | ICD-10-CM | POA: Diagnosis not present

## 2019-08-17 DIAGNOSIS — R319 Hematuria, unspecified: Secondary | ICD-10-CM | POA: Diagnosis not present

## 2019-08-17 DIAGNOSIS — E782 Mixed hyperlipidemia: Secondary | ICD-10-CM | POA: Diagnosis not present

## 2019-08-17 DIAGNOSIS — R69 Illness, unspecified: Secondary | ICD-10-CM | POA: Diagnosis not present

## 2019-08-17 DIAGNOSIS — K219 Gastro-esophageal reflux disease without esophagitis: Secondary | ICD-10-CM | POA: Diagnosis not present

## 2019-09-14 DIAGNOSIS — I7300 Raynaud's syndrome without gangrene: Secondary | ICD-10-CM | POA: Diagnosis not present

## 2019-09-14 DIAGNOSIS — I10 Essential (primary) hypertension: Secondary | ICD-10-CM | POA: Diagnosis not present

## 2019-09-14 DIAGNOSIS — Z6826 Body mass index (BMI) 26.0-26.9, adult: Secondary | ICD-10-CM | POA: Diagnosis not present

## 2019-09-14 DIAGNOSIS — K219 Gastro-esophageal reflux disease without esophagitis: Secondary | ICD-10-CM | POA: Diagnosis not present

## 2019-09-14 DIAGNOSIS — G3184 Mild cognitive impairment, so stated: Secondary | ICD-10-CM | POA: Diagnosis not present

## 2019-09-14 DIAGNOSIS — H26499 Other secondary cataract, unspecified eye: Secondary | ICD-10-CM | POA: Diagnosis not present

## 2019-09-14 DIAGNOSIS — R319 Hematuria, unspecified: Secondary | ICD-10-CM | POA: Diagnosis not present

## 2019-09-14 DIAGNOSIS — M810 Age-related osteoporosis without current pathological fracture: Secondary | ICD-10-CM | POA: Diagnosis not present

## 2019-09-14 DIAGNOSIS — R69 Illness, unspecified: Secondary | ICD-10-CM | POA: Diagnosis not present

## 2019-09-14 DIAGNOSIS — H9319 Tinnitus, unspecified ear: Secondary | ICD-10-CM | POA: Diagnosis not present

## 2019-09-14 DIAGNOSIS — E782 Mixed hyperlipidemia: Secondary | ICD-10-CM | POA: Diagnosis not present

## 2019-09-14 DIAGNOSIS — R55 Syncope and collapse: Secondary | ICD-10-CM | POA: Diagnosis not present

## 2019-09-14 DIAGNOSIS — R7303 Prediabetes: Secondary | ICD-10-CM | POA: Diagnosis not present

## 2019-09-14 DIAGNOSIS — Z23 Encounter for immunization: Secondary | ICD-10-CM | POA: Diagnosis not present

## 2019-09-14 DIAGNOSIS — H9311 Tinnitus, right ear: Secondary | ICD-10-CM | POA: Diagnosis not present

## 2019-10-12 DIAGNOSIS — I7300 Raynaud's syndrome without gangrene: Secondary | ICD-10-CM | POA: Diagnosis not present

## 2019-10-12 DIAGNOSIS — R319 Hematuria, unspecified: Secondary | ICD-10-CM | POA: Diagnosis not present

## 2019-10-12 DIAGNOSIS — R69 Illness, unspecified: Secondary | ICD-10-CM | POA: Diagnosis not present

## 2019-10-12 DIAGNOSIS — R0902 Hypoxemia: Secondary | ICD-10-CM | POA: Diagnosis not present

## 2019-10-12 DIAGNOSIS — E782 Mixed hyperlipidemia: Secondary | ICD-10-CM | POA: Diagnosis not present

## 2019-10-12 DIAGNOSIS — M810 Age-related osteoporosis without current pathological fracture: Secondary | ICD-10-CM | POA: Diagnosis not present

## 2019-10-12 DIAGNOSIS — I10 Essential (primary) hypertension: Secondary | ICD-10-CM | POA: Diagnosis not present

## 2019-10-12 DIAGNOSIS — Z0001 Encounter for general adult medical examination with abnormal findings: Secondary | ICD-10-CM | POA: Diagnosis not present

## 2019-10-12 DIAGNOSIS — E039 Hypothyroidism, unspecified: Secondary | ICD-10-CM | POA: Diagnosis not present

## 2019-10-12 DIAGNOSIS — G3184 Mild cognitive impairment, so stated: Secondary | ICD-10-CM | POA: Diagnosis not present

## 2019-10-12 DIAGNOSIS — I499 Cardiac arrhythmia, unspecified: Secondary | ICD-10-CM | POA: Diagnosis not present

## 2019-12-02 ENCOUNTER — Ambulatory Visit: Payer: Medicare HMO | Attending: Internal Medicine

## 2019-12-02 DIAGNOSIS — Z23 Encounter for immunization: Secondary | ICD-10-CM

## 2019-12-02 NOTE — Progress Notes (Signed)
   Covid-19 Vaccination Clinic  Name:  Jenna Booth    MRN: [id number] DOB: 28-Jul-1945  12/02/2019  Jenna Booth was observed post Covid-19 immunization for 15 minutes without incident. She was provided with Vaccine Information Sheet and instruction to access the V-Safe system.   Jenna Booth was instructed to call [phone] with any severe reactions post vaccine: Marland Kitchen Difficulty breathing  . Swelling of face and throat  . A fast heartbeat  . A bad rash all over body  . Dizziness and weakness

## 2019-12-19 NOTE — Progress Notes (Signed)
Cardiology Office Note    Date:  12/21/2019   ID:  ZARAY GATCHEL, DOB 07/06/1945, MRN [id number]  PCP:  Celene Squibb, MD  Cardiologist:  Carlyle Dolly, MD  Electrophysiologist:  None   Chief Complaint: evaluation for arrhythmia  History of  Present Illness:   Jenna Booth is a 74 y.o. female with history of acid reflux, HTN, HLD (managed by primary care), osteoarthritis, osteoporosis, anxiety, depression, GERD, Raynaud's and syncope felt due to orthostasis who presents for evaluation of possible arrrhythmia. She was remotely seen by Dr. Harl Bowie several years ago for episode of syncope in 01/2017 felt due to orthostasis. Carotid duplex 02/2017 without significant disease. CT head no acute intracranial process. No prior cardiac testing on file although the patient recalls that 8 years ago when her husband was dying of CHF, she underwent event monitoring and stress testing that was unrevealing. The symptoms were later attributed to the situational stress.   She was referred back to [place] for evaluation of irregular heartbeat. She recently saw her PCP on 10/12/19 and note includes "irregular rhythm - EKG today? I wonder if flipping in and out of atrial fibrillation." EKG showed NSR that day.  The patient indicates that her primary care doctor had heard something on the exam that day.  She denies any awareness of any palpitations herself.  She does admit that it is been a rough year.  Sometime around the summer or fall of 2020 (she cannot recall), she was sick for several weeks with what she thought was likely Covid.  She did not go get tested at that time but had issues with chest tightness, severe weakness and shortness of breath for several weeks.  She denies seeking care for this.  Subsequent to this event she has noticed significant memory fog as well as decreased functional endurance anytime she exerts herself.  She reports this is more so a generalized fatigue rather than actual dyspnea.   She has not had any chest pain.  She was referred to neurology.  She is feeling discouraged at her memory fog as she is usually quite sharp.  She was walking 2 miles a day 6 days a week and prior years before she got sick.  She indicates that when she got her first Covid vaccine in February 2021, she felt extremely poorly, evoking symptoms similar to the original mystery illness as well as significant headaches.  She then went on to tolerate her second and booster shots without any adverse effect.   Labwork independently reviewed: Scanned labs 10/12/19 Hgb 13.2, Plt 201, glucose 108, BUN 18, Cr 0.68, Na 140, K 4.5, albumin 4.8, AST/ALT OK, LDL 246 (Tchol 365, trig 104, HDL 102), A1c 5.3,  04/2017 TSH wnl 2018 CMET ok except BUN 34, WBC 11.2, Hgb 12.5    Past Medical History:  Diagnosis Date   Acid reflux    Anxiety    Cognitive impairment    Depression    Dizzy spells    GERD (gastroesophageal reflux disease)    Hyperlipidemia    Hypertension    Osteoarthritis    Osteoporosis    Raynaud's disease without gangrene     Past Surgical History:  Procedure Laterality Date   CATARACT EXTRACTION W/PHACO Left 10/12/2012   Procedure: CATARACT EXTRACTION PHACO AND INTRAOCULAR LENS PLACEMENT ([place]);  Surgeon: Tonny Branch, MD;  Location: [facility];  Service: Ophthalmology;  Laterality: Left;  CDE:16.97   CRANIOTOMY Left 06/09/2012   Procedure: CRANIOTOMY HEMATOMA EVACUATION SUBDURAL;  Surgeon: Floyce Stakes, MD;  Location: [facility] OR[facility];  Service: Neurosurgery;  Laterality: Left;   RHINOPLASTY      Current Medications: Current Meds  Medication Sig   acetaminophen (TYLENOL) 500 MG tablet Take 500 mg by mouth 2 (two) times daily.   Alpha-Lipoic Acid 600 MG CAPS Take 600 mg by mouth daily.   Cholecalciferol (VITAMIN D3 PO) Take 15,000 Units by mouth daily.   Docosahexaenoic Acid (DHA PO) Take 1,000 mg by mouth daily.   glucosamine-chondroitin 500-400 MG tablet Take 1 tablet by mouth 2  (two) times daily.   Magnesium 250 MG TABS Take 1 tablet by mouth daily.   Menaquinone-7 (VITAMIN K2) 100 MCG CAPS Take 100 mcg by mouth daily.   Methylsulfonylmethane (MSM) 1000 MG CAPS Take 1 capsule by mouth daily.   Probiotic Product (PROBIOTIC DAILY PO) Take 1 tablet by mouth daily.   traMADol (ULTRAM) 50 MG tablet Take 50 mg by mouth as needed.   UNABLE TO FIND Med Name: tumeric 350 mg 2 x daily   UNABLE TO FIND Med Name: MCT oil, 1 tea daily     Allergies:   Patient has no known allergies.   Social History   Socioeconomic History   Marital status: Widowed    Spouse name: Not on file   Number of children: 0   Years of education: Not on file   Highest education level: Some college, no degree  Occupational History    Comment: retired  Tobacco Use   Smoking status: Never Smoker   Smokeless tobacco: Never Used  Scientific laboratory technician Use: Never used  Substance and Sexual Activity   Alcohol use: Never   Drug use: Never   Sexual activity: Not on file  Other Topics Concern   Not on file  Social History Narrative   01/18/19 lives alone   Caffeine- coffee 1 c daily   Social Determinants of Health   Financial Resource Strain:    Difficulty of Paying Living Expenses: Not on file  Food Insecurity:    Worried About Charity fundraiser in the Last Year: Not on file   [employer] of Food in the Last Year: Not on file  Transportation Needs:    Lack of Transportation (Medical): Not on file   Lack of Transportation (Non-Medical): Not on file  Physical Activity:    Days of Exercise per Week: Not on file   Minutes of Exercise per Session: Not on file  Stress:    Feeling of Stress : Not on file  Social Connections:    Frequency of Communication with Friends and Family: Not on file   Frequency of Social Gatherings with Friends and Family: Not on file   Attends Religious Services: Not on file   Active Member of Clubs or Organizations: Not on file    Attends Archivist Meetings: Not on file   Marital Status: Not on file     Family History:  The patient's family history includes Cardiomyopathy in her sister; Hyperlipidemia in her sister; Hypertension in her sister; Lung cancer in her father; Other in her paternal grandfather and paternal grandmother; Rheum arthritis in her sister; Thyroid disease in her maternal grandfather and mother; Ulcerative colitis in her mother.  ROS:   Please see the history of present illness.  All other systems are reviewed and otherwise negative.    EKGs/Labs/Other Studies Reviewed:    Studies reviewed are outlined and summarized above. Reports included below if pertinent.  n/a    EKG:  EKG is ordered today, personally reviewed, demonstrating NSR 75bpm occasional PACs, low voltage, old anterior infarct, nonspecific STT Changes  Recent Labs: No results found for requested labs within last 8760 hours.  Recent Lipid Panel No results found for: CHOL, TRIG, HDL, CHOLHDL, VLDL, LDLCALC, LDLDIRECT  PHYSICAL EXAM:    VS:  BP 136/86    Pulse 84    Ht 5' (1.524 m)    Wt 129 lb (58.5 kg)    SpO2 98%    BMI 25.19 kg/m   BMI: Body mass index is 25.19 kg/m.  GEN: Well nourished, well developed WF, in no acute distress HEENT: normocephalic, atraumatic Neck: no JVD, carotid bruits, or masses Cardiac: RRR; no murmurs, rubs, or gallops, no edema  Respiratory:  clear to auscultation bilaterally, normal work of breathing GI: soft, nontender, nondistended, + BS MS: no deformity or atrophy Skin: warm and dry, no rash Neuro:  Alert and Oriented x 3, Strength and sensation are intact, follows commands Psych: euthymic mood, full affect  Wt Readings from Last 3 Encounters:  12/21/19 129 lb (58.5 kg)  01/19/19 128 lb 12.8 oz (58.4 kg)  10/10/17 126 lb 9.6 oz (57.4 kg)     ASSESSMENT & PLAN:   1. Irregular rhythm - EKG today shows sinus rhythm with one PAC.  It is difficult to know what this  represented at time of physical exam in September 2021.  She does not have any overt symptoms at this time.  We will undertake a 2-week ZIO monitor to evaluate for arrhythmia.  We will also reach out to primary care's office to see if we can get a copy of her last thyroid labs. Otherwise labs from 10/2019 are outlined above. 2. Worsened functional endurance -based on clinical experience, patients who have a significant reaction to the first Covid vaccine have often had their immune system primed by a prior Covid infection.  Therefore it makes sense that perhaps her mystery illness in 2020 was Covid.  Unfortunately she did not get tested at that time to confirm this theory.  She is experiencing generalized fatigue with any activity as well as memory fog.  This is being followed by both primary care and neurology.  I would recommend we start with an echocardiogram to assess her LV function.  If that is normal, it would be reasonable to proceed with a coronary CTA vs Lexiscan nuclear stress test for screening purposes given her uncontrolled hyperlipidemia.  CTA would be more ideal to concomitantly evaluate lung fields and guide aggressiveness of lipid-lowering therapy.  If her LV function is down, she will require consideration for cardiac catheterization, but she does not really have any specific sequelae on exam that suggest this is the case. 3. Essential HTN - blood pressure parameters are modestly elevated today, but given history of syncope and orthostasis as well as generalized fatigue, I would not make any changes today. 4. History of syncope & orthostasis - quiescent. 5. Hyperlipidemia - LDL of 246 noted.  She has declined prior therapy by her primary care provider.  I would suspect degree of familial hyperlipidemia based on this level.  I offered her referral to our pharmacy clinic for consideration of PCSK9 as a statin is often insufficient to bring this down to go.  She would like to hold off on this time  but was encouraged to let us know if she changes her mind. We discussed that uncontrolled HLD puts her at significant increased  risk compared to general population of CAD/MI/stroke.  Disposition: F/u with myself or APP in 4-6 weeks.  Medication Adjustments/Labs and Tests Ordered: Current medicines are reviewed at length with the patient today.  Concerns regarding medicines are outlined above. Medication changes, Labs and Tests ordered today are summarized above and listed in the Patient Instructions accessible in Encounters.   Signed, Charlie Pitter, PA-C  12/21/2019 4:46 PM    [facility] Location in [place], [place] [address] Ph: [phone]; Fax [phone]

## 2019-12-21 ENCOUNTER — Encounter: Payer: Self-pay | Admitting: Physician Assistant

## 2019-12-21 ENCOUNTER — Other Ambulatory Visit (INDEPENDENT_AMBULATORY_CARE_PROVIDER_SITE_OTHER): Payer: Medicare HMO

## 2019-12-21 ENCOUNTER — Other Ambulatory Visit: Payer: Self-pay

## 2019-12-21 ENCOUNTER — Ambulatory Visit: Payer: Medicare HMO | Admitting: Physician Assistant

## 2019-12-21 ENCOUNTER — Encounter: Payer: Self-pay | Admitting: *Deleted

## 2019-12-21 VITALS — BP 136/86 | HR 84 | Ht 60.0 in | Wt 129.0 lb

## 2019-12-21 DIAGNOSIS — Z87898 Personal history of other specified conditions: Secondary | ICD-10-CM

## 2019-12-21 DIAGNOSIS — E785 Hyperlipidemia, unspecified: Secondary | ICD-10-CM | POA: Diagnosis not present

## 2019-12-21 DIAGNOSIS — I499 Cardiac arrhythmia, unspecified: Secondary | ICD-10-CM

## 2019-12-21 DIAGNOSIS — I951 Orthostatic hypotension: Secondary | ICD-10-CM | POA: Diagnosis not present

## 2019-12-21 DIAGNOSIS — R6889 Other general symptoms and signs: Secondary | ICD-10-CM

## 2019-12-21 DIAGNOSIS — I10 Essential (primary) hypertension: Secondary | ICD-10-CM

## 2019-12-21 NOTE — Patient Instructions (Addendum)
Medication Instructions:  Your physician recommends that you continue on your current medications as directed. Please refer to the Current Medication list given to you today.  *If you need a refill on your cardiac medications before your next appointment, please call your pharmacy*   Lab Work: NONE   If you have labs (blood work) drawn today and your tests are completely normal, you will receive your results only by: Marland Kitchen MyChart Message (if you have MyChart) OR . A paper copy in the mail If you have any lab test that is abnormal or we need to change your treatment, we will call you to review the results.   Testing/Procedures: Your physician has requested that you have an echocardiogram. Echocardiography is a painless test that uses sound waves to create images of your heart. It provides your doctor with information about the size and shape of your heart and how well your heart's chambers and valves are working. This procedure takes approximately one hour. There are no restrictions for this procedure.  ZIO XT- Long Term Monitor Instructions   Your physician has requested you wear your ZIO patch monitor___14____days.   This is a single patch monitor.  [employer] supplies one patch monitor per enrollment.  Additional stickers are not available.   Please do not apply patch if you will be having a Nuclear Stress Test, Echocardiogram, Cardiac CT, MRI, or Chest Xray during the time frame you would be wearing the monitor. The patch cannot be worn during these tests.  You cannot remove and re-apply the ZIO XT patch monitor.   Your ZIO patch monitor will be sent USPS Priority mail from [facility] directly to your home address. The monitor may also be mailed to a PO BOX if home delivery is not available.   It may take 3-5 days to receive your monitor after you have been enrolled.   Once you have received you monitor, please review enclosed instructions.  Your monitor has already been registered  assigning a specific monitor serial # to you.   Do not shower for the first 24 hours.  You may shower after the first 24 hours.   Press button if you feel a symptom. You will hear a small click.  Record Date, Time and Symptom in the Patient Log Book.   When you are ready to remove patch, follow instructions on last 2 pages of Patient Log Book.  Stick patch monitor onto last page of Patient Log Book.   Place Patient Log Book in [place] box.  Use locking tab on box and tape box closed securely.  The [employer] has [employer] on it.  Please place in mailbox as soon as possible.  Your physician should have your test results approximately 7 days after the monitor has been mailed back to [facility].   Call [place] at [phone] if you have questions regarding your ZIO XT patch monitor.  Call them immediately if you see an orange light blinking on your monitor.   If your monitor falls off in less than 4 days contact our Monitor department at [phone].  If your monitor becomes loose or falls off after 4 days call [employer] at [phone] for suggestions on securing your monitor.     Follow-Up: At [facility], you and your health needs are our priority.  As part of our continuing mission to provide you with exceptional heart care, we have created designated Provider Care Teams.  These Care Teams include your primary  Cardiologist (physician) and Advanced Practice Providers (APPs -  Physician Assistants and Nurse Practitioners) who all work together to provide you with the care you need, when you need it.  We recommend signing up for the patient portal called "MyChart".  Sign up information is provided on this After Visit Summary.  MyChart is used to connect with patients for Virtual Visits (Telemedicine).  Patients are able to view lab/test results, encounter notes, upcoming appointments, etc.  Non-urgent messages can be sent to your provider as well.   To  learn more about what you can do with MyChart, go to [email or web address].    Your next appointment:   4-6 week(s)  The format for your next appointment:   In Person  Provider:   You will see one of the following Advanced Practice Providers on your designated Care Team:    Bernerd Pho, PA-C   Ermalinda Barrios, PA-C      Other Instructions Thank you for choosing [facility]!     Let us know if you would like a referral to our pharmacy team's cholesterol clinic to discuss PCSK-9 inhibitors (injectable medicine for the type of cholesterol problem you likely have).

## 2020-01-05 ENCOUNTER — Ambulatory Visit (HOSPITAL_COMMUNITY)
Admission: RE | Admit: 2020-01-05 | Discharge: 2020-01-05 | Disposition: A | Discharge status: Home / Self Care | Payer: Medicare HMO | Source: Ambulatory Visit | Attending: Physician Assistant | Admitting: Physician Assistant

## 2020-01-05 ENCOUNTER — Other Ambulatory Visit: Payer: Self-pay

## 2020-01-05 DIAGNOSIS — R6889 Other general symptoms and signs: Secondary | ICD-10-CM | POA: Insufficient documentation

## 2020-01-05 DIAGNOSIS — I10 Essential (primary) hypertension: Secondary | ICD-10-CM | POA: Diagnosis not present

## 2020-01-05 DIAGNOSIS — E785 Hyperlipidemia, unspecified: Secondary | ICD-10-CM | POA: Insufficient documentation

## 2020-01-05 DIAGNOSIS — R008 Other abnormalities of heart beat: Secondary | ICD-10-CM

## 2020-01-05 LAB — ECHOCARDIOGRAM COMPLETE
Area-P 1/2: 1.4 cm2
S' Lateral: 2.4 cm

## 2020-01-05 NOTE — Progress Notes (Signed)
*  PRELIMINARY RESULTS* Echocardiogram 2D Echocardiogram has been performed.  Jenna Booth 01/05/2020, 9:52 AM

## 2020-01-06 ENCOUNTER — Encounter: Payer: Self-pay | Admitting: *Deleted

## 2020-01-06 ENCOUNTER — Telehealth: Payer: Self-pay | Admitting: *Deleted

## 2020-01-06 DIAGNOSIS — R0602 Shortness of breath: Secondary | ICD-10-CM

## 2020-01-06 DIAGNOSIS — R6889 Other general symptoms and signs: Secondary | ICD-10-CM

## 2020-01-06 DIAGNOSIS — I499 Cardiac arrhythmia, unspecified: Secondary | ICD-10-CM

## 2020-01-06 MED ORDER — METOPROLOL TARTRATE 100 MG PO TABS: 100.0000 mg | ORAL_TABLET | Freq: Two times a day (BID) | ORAL | 0 refills | Status: DC

## 2020-01-06 NOTE — Telephone Encounter (Signed)
-----   Message from Charlie Pitter, [place] sent at 01/06/2020  8:50 AM EST ----- Please let pt know echo was reassuring without a cause for her symptoms. If she is still experiencing decrease in her functional capacity / fatigue with exertion would recommend we set her up for a coronary CTA with FFR to further evaluation. Essentially what we are looking for is to make sure she has not developed any heart blockages causing her symptoms, since her echo did not show any obvious abnormalities structurally with the pump or valves. Cr in 10/2019 by scanned labs was normal but will need repeat beforehand to assess. Would follow standard beta blocker protocol. Thanks.

## 2020-01-06 NOTE — Telephone Encounter (Signed)
Call placed to pt to discuss her echocardiogram results.  She understands we ordered a Cardiac CT and I will send her the instructions via mychart.  Your cardiac CT will be scheduled at one of the below locations:   [facility] [address] [place],  [address] [phone]   If scheduled at [facility], please arrive at the [facility] main entrance of [facility] 30 minutes prior to test start time. Proceed to the [facility] Radiology Department (first floor) to check-in and test prep.  If scheduled at [facility], please arrive 15 mins early for check-in and test prep.  Please follow these instructions carefully (unless otherwise directed):  On the Night Before the Test: . Be sure to Drink plenty of water. . Do not consume any caffeinated/decaffeinated beverages or chocolate 12 hours prior to your test. . Do not take any antihistamines 12 hours prior to your test.   On the Day of the Test: . Drink plenty of water. Do not drink any water within one hour of the test. . Do not eat any food 4 hours prior to the test. . You may take your regular medications prior to the test.  . Take metoprolol (Lopressor) 100 mg tablet (this has been sent to [facility])  two hours prior to test. . FEMALES- please wear underwire-free bra if available      After the Test: . Drink plenty of water. . After receiving IV contrast, you may experience a mild flushed feeling. This is normal. . On occasion, you may experience a mild rash up to 24 hours after the test. This is not dangerous. If this occurs, you can take Benadryl 25 mg and increase your fluid intake. . If you experience trouble breathing, this can be serious. If it is severe call 911 IMMEDIATELY. If it is mild, please call our office. . If you take any of these medications: Glipizide/Metformin, Avandament, Glucavance, please do not take 48 hours after completing test  unless otherwise instructed.   Once we have confirmed authorization from your insurance company, we will call you to set up a date and time for your test. Based on how quickly your insurance processes prior authorizations requests, please allow up to 4 weeks to be contacted for scheduling your Cardiac CT appointment. Be advised that routine Cardiac CT appointments could be scheduled as many as 8 weeks after your provider has ordered it.  For non-scheduling related questions, please contact the cardiac imaging nurse navigator should you have any questions/concerns: Marchia Bond, Cardiac Imaging Nurse Navigator Burley Saver, Interim Cardiac Imaging Nurse [facility] Direct Office Dial: [phone]   For scheduling needs, including cancellations and rescheduling, please call [place], [phone].

## 2020-01-10 ENCOUNTER — Telehealth: Payer: Self-pay | Admitting: Physician Assistant

## 2020-01-10 NOTE — Telephone Encounter (Signed)
-----   Message from Charlie Pitter, [place] sent at 01/10/2020 11:56 AM EST ----- Regarding: FW: ct heart  ----- Message ----- From: Charlie Pitter, PA-C Sent: 01/10/2020  11:55 AM EST To: Jeanann Lewandowsky, RMA, Melony Overly Subject: FW: ct heart                                   Anderson [place], can we maybe see if social work can help? Thanks ----- Message ----- From: Melony Overly Sent: 01/10/2020  11:30 AM EST To: Charlie Pitter, PA-C Subject: ct heart                                       I spoke with the pt she want to cancel this exam because she can not travel to either site to get this exam done.  Thanks, [place]

## 2020-01-11 ENCOUNTER — Telehealth: Payer: Self-pay | Admitting: Licensed Clinical Social Worker

## 2020-01-11 NOTE — Telephone Encounter (Signed)
CSW received referral to assist patient with transportation needs. CSW attempted to contact patient although message left. CSW will send enrollment form for transport and inform patient upon return call process for obtaining transport with [facility]. Raquel Sarna, [place], [place]

## 2020-01-13 ENCOUNTER — Telehealth: Payer: Self-pay | Admitting: Licensed Clinical Social Worker

## 2020-01-13 NOTE — Telephone Encounter (Signed)
CSW left message for return call regarding transportation enrollment with [employer]. CSW sent form but unable to reach  Patient to inform and confirm. Awaiting return call. Raquel Sarna, [place], [place]

## 2020-01-14 DIAGNOSIS — I499 Cardiac arrhythmia, unspecified: Secondary | ICD-10-CM | POA: Diagnosis not present

## 2020-01-31 NOTE — Progress Notes (Signed)
Cardiology Office Note    Date:  02/01/2020   ID:  Jenna Booth, DOB May 13, 1945, MRN [id number]  PCP:  Celene Squibb, MD  Cardiologist: Carlyle Dolly, MD    Chief Complaint  Patient presents with  . Follow-up    6 week visit    History of Present Illness:    Jenna Booth is a 74 y.o. female with past medical history of HTN, GERD, Raynaud's phenomena and prior syncope felt to be secondary to orthostasis who presents to the office today for 6-week follow-up.  She was last examined by Melina Copa, PA-C on 12/21/2019 and had recently been informed by her PCP she had an "irregular rhythm" but EKG that day had shown normal sinus rhythm. She did report progressive fatigue but denied any specific dyspnea on exertion, chest pain or palpitations. Also reported having memory fog since being diagnosed with what she thought was likely Covid in Fall 2020 but was never formally tested. She did have a PAC on her EKG at the time of her office visit and it was recommended that she have a 2-week ZIO monitor. Given her generalized fatigue, an echocardiogram was also recommended and if reassuring would pursue a Coronary CTA or Lexiscan. Her echocardiogram did show a preserved EF of 60 to 65% with no regional wall motion abnormalities. She had trivial MR but no significant valve abnormalities. It was recommended to pursue a Coronary CTA for further evaluation but this has not yet been performed due to transportation issues. Her ZIO monitor is available for review but has not yet been officially resulted. Overall, this showed predominantly normal sinus rhythm with an average heart rate of 61 bpm. She did have runs of SVT with the longest lasting for 30.9 seconds. Isolated SVE and ventricular ectopy were less than 1%.  In talking with the patient today, she reports still having dyspnea on exertion and fatigue which has been stable for over the past year. She denies any associated chest pain or  palpitations. No recent orthopnea, PND or edema. Still having issues with "memory fog" and is being followed by Neurology.    Past Medical History:  Diagnosis Date  . Acid reflux   . Anxiety   . Cognitive impairment   . Depression   . Dizzy spells   . GERD (gastroesophageal reflux disease)   . Hyperlipidemia   . Hypertension   . Osteoarthritis   . Osteoporosis   . Raynaud's disease without gangrene     Past Surgical History:  Procedure Laterality Date  . CATARACT EXTRACTION W/PHACO Left 10/12/2012   Procedure: CATARACT EXTRACTION PHACO AND INTRAOCULAR LENS PLACEMENT (IOC);  Surgeon: Tonny Branch, MD;  Location: [facility];  Service: Ophthalmology;  Laterality: Left;  CDE:16.97  . CRANIOTOMY Left 06/09/2012   Procedure: CRANIOTOMY HEMATOMA EVACUATION SUBDURAL;  Surgeon: Floyce Stakes, MD;  Location: [facility];  Service: Neurosurgery;  Laterality: Left;  . RHINOPLASTY      Current Medications: Outpatient Medications Prior to Visit  Medication Sig Dispense Refill  . acetaminophen (TYLENOL) 500 MG tablet Take 500 mg by mouth 2 (two) times daily.    . Alpha-Lipoic Acid 600 MG CAPS Take 600 mg by mouth daily.    . Cholecalciferol (VITAMIN D3 PO) Take 15,000 Units by mouth daily.    . Docosahexaenoic Acid (DHA PO) Take 1,000 mg by mouth daily.    Marland Kitchen glucosamine-chondroitin 500-400 MG tablet Take 1 tablet by mouth 2 (two) times daily.    Marland Kitchen  Magnesium 250 MG TABS Take 1 tablet by mouth daily.    . Menaquinone-7 (VITAMIN K2) 100 MCG CAPS Take 100 mcg by mouth daily.    . Methylsulfonylmethane (MSM) 1000 MG CAPS Take 1 capsule by mouth daily.    . Probiotic Product (PROBIOTIC DAILY PO) Take 1 tablet by mouth daily.    . traMADol (ULTRAM) 50 MG tablet Take 50 mg by mouth as needed.    Marland Kitchen UNABLE TO FIND Med Name: tumeric 350 mg 2 x daily    . metoprolol tartrate (LOPRESSOR) 100 MG tablet Take 1 tablet (100 mg total) by mouth 2 (two) times daily. 1 tablet 0  . escitalopram (LEXAPRO) 10 MG tablet  Take 10 mg by mouth daily.    Marland Kitchen UNABLE TO FIND Med Name: MCT oil, 1 tea daily     No facility-administered medications prior to visit.     Allergies:   Patient has no known allergies.   Social History   Socioeconomic History  . Marital status: Widowed    Spouse name: Not on file  . Number of children: 0  . Years of education: Not on file  . Highest education level: Some college, no degree  Occupational History    Comment: retired  Tobacco Use  . Smoking status: Never Smoker  . Smokeless tobacco: Never Used  Vaping Use  . Vaping Use: Never used  Substance and Sexual Activity  . Alcohol use: Never  . Drug use: Never  . Sexual activity: Not on file  Other Topics Concern  . Not on file  Social History Narrative   01/18/19 lives alone   Caffeine- coffee 1 c daily   Social Determinants of Health   Financial Resource Strain: Not on file  Food Insecurity: Not on file  Transportation Needs: Not on file  Physical Activity: Not on file  Stress: Not on file  Social Connections: Not on file     Family History:  The patient's family history includes Cardiomyopathy in her sister; Hyperlipidemia in her sister; Hypertension in her sister; Lung cancer in her father; Other in her paternal grandfather and paternal grandmother; Rheum arthritis in her sister; Thyroid disease in her maternal grandfather and mother; Ulcerative colitis in her mother.   Review of Systems:   Please see the history of present illness.     General:  No chills, fever, night sweats or weight changes.  Cardiovascular:  No chest pain, edema, orthopnea, palpitations, paroxysmal nocturnal dyspnea. Positive for dyspnea on exertion.  Dermatological: No rash, lesions/masses Respiratory: No cough.  Urologic: No hematuria, dysuria Abdominal:   No nausea, vomiting, diarrhea, bright red blood per rectum, melena, or hematemesis Neurologic:  No visual changes, wkns, changes in mental status. Positive for memory loss.    All other systems reviewed and are otherwise negative except as noted above.   Physical Exam:    VS:  BP 126/78   Pulse 78   Ht 5' (1.524 m)   Wt 129 lb (58.5 kg)   SpO2 98%   BMI 25.19 kg/m    General: Pleasant, elderly female appearing in no acute distress. Head: Normocephalic, atraumatic. Neck: No carotid bruits. JVD not elevated.  Lungs: Respirations regular and unlabored, without wheezes or rales.  Heart: Regular rate and rhythm. No S3 or S4.  No murmur, no rubs, or gallops appreciated. Abdomen: Appears non-distended. No obvious abdominal masses. Msk:  Strength and tone appear normal for age. No obvious joint deformities or effusions. Extremities: No clubbing or cyanosis. No  lower extremity edema.  Distal pedal pulses are 2+ bilaterally. Neuro: Alert and oriented X 3. Moves all extremities spontaneously. No focal deficits noted. Psych:  Responds to questions appropriately with a normal affect. Skin: No rashes or lesions noted  Wt Readings from Last 3 Encounters:  02/01/20 129 lb (58.5 kg)  12/21/19 129 lb (58.5 kg)  01/19/19 128 lb 12.8 oz (58.4 kg)     Studies/Labs Reviewed:   EKG:  EKG is not ordered today.   Recent Labs: No results found for requested labs within last 8760 hours.   Lipid Panel No results found for: CHOL, TRIG, HDL, CHOLHDL, VLDL, LDLCALC, LDLDIRECT  Additional studies/ records that were reviewed today include:   Echocardiogram: 01/05/2020 IMPRESSIONS    1. Left ventricular ejection fraction, by estimation, is 60 to 65%. The  left ventricle has normal function. The left ventricle has no regional  wall motion abnormalities. Left ventricular diastolic parameters are  indeterminate.  2. Right ventricular systolic function is normal. The right ventricular  size is normal. Tricuspid regurgitation signal is inadequate for assessing  PA pressure.  3. The mitral valve is grossly normal. Trivial mitral valve  regurgitation.  4. The aortic  valve is tricuspid. Aortic valve regurgitation is not  visualized.  5. The inferior vena cava is normal in size with greater than 50%  respiratory variability, suggesting right atrial pressure of 3 mmHg.   Zio Monitor: 01/2020 Patient had a min HR of 41 bpm, max HR of 182 bpm, and avg HR of 61 bpm. Predominant underlying rhythm was Sinus Rhythm. 4073 Supraventricular Tachycardia runs occurred, the run with the fastest interval lasting 7 beats with a max rate of 182 bpm, the longest lasting 30.9 secs with an avg rate of 132 bpm. Isolated SVEs were rare (<1.0%), SVE Couplets were rare (<1.0%), and SVE Triplets were rare (<1.0%). Isolated VEs were rare (<1.0%), VE Couplets were rare (<1.0%), and no VE Triplets were present. Ventricular Trigeminy was present.  Assessment:    1. Dyspnea on exertion   2. Paroxysmal SVT (supraventricular tachycardia) (HCC)   3. Hyperlipidemia, unspecified hyperlipidemia type      Plan:   In order of problems listed above:  1. Dyspnea on Exertion/Fatigue - She has been experiencing symptoms for over the past year following a sickness which she believes was COVID-19 but was not formally tested.  - Recent echocardiogram was reassuring and showed a preserved EF with no significant WMA. She did not undergo a Coronary CT due to transportation issues to [place]. I reviewed with her today a NST could be obtained here at [facility] for ischemic evaluation but she wishes to hold off on all additional testing at this time. I encouraged her to call back if she changes her mind and would like to pursue a Coronary CT or NST as both were again reviewed with her today. If ischemic workup is unrevealing, would recommend PFT's/Pulmonolgy referral.   2. pSVT - Recent ZIO monitor showed predominantly normal sinus rhythm with an average heart rate of 61 bpm. She did have runs of SVT with the longest lasting for 30.9 seconds. - She denies any recent palpitations. We reviewed  possible medical therapy but she prefers conservative management for now given her asymptomatic state. Will request most recent labs from her PCP to make sure electrolytes and TSH have been obtained within the past several months. She was also encouraged to limit her caffeine intake.   3. HLD - LDL was previously elevated to  greater than 200. Will request most recent labs from her PCP. PCSK9 inhibitor therapy was previously reviewed but she was not interested at that time and also declined statin therapy in the past.    Medication Adjustments/Labs and Tests Ordered: Current medicines are reviewed at length with the patient today.  Concerns regarding medicines are outlined above.  Medication changes, Labs and Tests ordered today are listed in the Patient Instructions below. Patient Instructions  Medication Instructions:  Your physician recommends that you continue on your current medications as directed. Please refer to the Current Medication list given to you today.  *If you need a refill on your cardiac medications before your next appointment, please call your pharmacy*   Lab Work: None today If you have labs (blood work) drawn today and your tests are completely normal, you will receive your results only by: Marland Kitchen MyChart Message (if you have MyChart) OR . A paper copy in the mail If you have any lab test that is abnormal or we need to change your treatment, we will call you to review the results.   Testing/Procedures: None today   Follow-Up: At [facility], you and your health needs are our priority.  As part of our continuing mission to provide you with exceptional heart care, we have created designated Provider Care Teams.  These Care Teams include your primary Cardiologist (physician) and Advanced Practice Providers (APPs -  Physician Assistants and Nurse Practitioners) who all work together to provide you with the care you need, when you need it.  We recommend signing up for the  patient portal called "MyChart".  Sign up information is provided on this After Visit Summary.  MyChart is used to connect with patients for Virtual Visits (Telemedicine).  Patients are able to view lab/test results, encounter notes, upcoming appointments, etc.  Non-urgent messages can be sent to your provider as well.   To learn more about what you can do with MyChart, go to [email or web address].    Your next appointment:   4 month(s)  The format for your next appointment:   In Person  Provider:   Dr.Branch or B.Tinesha Siegrist, PA-C   Other Instructions Call us back if you decide to have any testing done.     Thank you for choosing [facility] !            Signed, Erma Heritage, PA-C  02/01/2020 7:44 PM    [place] [address] [place], [place] [address] Phone: [phone] Fax: [phone]

## 2020-02-01 ENCOUNTER — Other Ambulatory Visit: Payer: Self-pay

## 2020-02-01 ENCOUNTER — Ambulatory Visit: Payer: Medicare HMO | Admitting: Student

## 2020-02-01 ENCOUNTER — Encounter: Payer: Self-pay | Admitting: Student

## 2020-02-01 VITALS — BP 126/78 | HR 78 | Ht 60.0 in | Wt 129.0 lb

## 2020-02-01 DIAGNOSIS — R0609 Other forms of dyspnea: Secondary | ICD-10-CM

## 2020-02-01 DIAGNOSIS — E785 Hyperlipidemia, unspecified: Secondary | ICD-10-CM | POA: Diagnosis not present

## 2020-02-01 DIAGNOSIS — R0600 Dyspnea, unspecified: Secondary | ICD-10-CM | POA: Diagnosis not present

## 2020-02-01 DIAGNOSIS — I471 Supraventricular tachycardia: Secondary | ICD-10-CM | POA: Diagnosis not present

## 2020-02-01 NOTE — Patient Instructions (Signed)
Medication Instructions:  Your physician recommends that you continue on your current medications as directed. Please refer to the Current Medication list given to you today.  *If you need a refill on your cardiac medications before your next appointment, please call your pharmacy*   Lab Work: None today If you have labs (blood work) drawn today and your tests are completely normal, you will receive your results only by: Marland Kitchen MyChart Message (if you have MyChart) OR . A paper copy in the mail If you have any lab test that is abnormal or we need to change your treatment, we will call you to review the results.   Testing/Procedures: None today   Follow-Up: At [facility], you and your health needs are our priority.  As part of our continuing mission to provide you with exceptional heart care, we have created designated Provider Care Teams.  These Care Teams include your primary Cardiologist (physician) and Advanced Practice Providers (APPs -  Physician Assistants and Nurse Practitioners) who all work together to provide you with the care you need, when you need it.  We recommend signing up for the patient portal called "MyChart".  Sign up information is provided on this After Visit Summary.  MyChart is used to connect with patients for Virtual Visits (Telemedicine).  Patients are able to view lab/test results, encounter notes, upcoming appointments, etc.  Non-urgent messages can be sent to your provider as well.   To learn more about what you can do with MyChart, go to [email or web address].    Your next appointment:   4 month(s)  The format for your next appointment:   In Person  Provider:   Dr.Branch or B.Strader, PA-C   Other Instructions Call us back if you decide to have any testing done.     Thank you for choosing  [facility] !

## 2020-02-04 DIAGNOSIS — I10 Essential (primary) hypertension: Secondary | ICD-10-CM | POA: Diagnosis not present

## 2020-02-04 DIAGNOSIS — R69 Illness, unspecified: Secondary | ICD-10-CM | POA: Diagnosis not present

## 2020-02-04 DIAGNOSIS — G3184 Mild cognitive impairment, so stated: Secondary | ICD-10-CM | POA: Diagnosis not present

## 2020-02-04 DIAGNOSIS — R319 Hematuria, unspecified: Secondary | ICD-10-CM | POA: Diagnosis not present

## 2020-02-04 DIAGNOSIS — I7300 Raynaud's syndrome without gangrene: Secondary | ICD-10-CM | POA: Diagnosis not present

## 2020-02-04 DIAGNOSIS — E782 Mixed hyperlipidemia: Secondary | ICD-10-CM | POA: Diagnosis not present

## 2020-02-04 DIAGNOSIS — M810 Age-related osteoporosis without current pathological fracture: Secondary | ICD-10-CM | POA: Diagnosis not present

## 2020-02-04 DIAGNOSIS — I499 Cardiac arrhythmia, unspecified: Secondary | ICD-10-CM | POA: Diagnosis not present

## 2020-03-30 DIAGNOSIS — H16223 Keratoconjunctivitis sicca, not specified as Sjogren's, bilateral: Secondary | ICD-10-CM | POA: Diagnosis not present

## 2020-04-03 DIAGNOSIS — I10 Essential (primary) hypertension: Secondary | ICD-10-CM | POA: Diagnosis not present

## 2020-04-03 DIAGNOSIS — E782 Mixed hyperlipidemia: Secondary | ICD-10-CM | POA: Diagnosis not present

## 2020-04-03 DIAGNOSIS — R0602 Shortness of breath: Secondary | ICD-10-CM | POA: Diagnosis not present

## 2020-04-03 DIAGNOSIS — K219 Gastro-esophageal reflux disease without esophagitis: Secondary | ICD-10-CM | POA: Diagnosis not present

## 2020-04-03 DIAGNOSIS — G3184 Mild cognitive impairment, so stated: Secondary | ICD-10-CM | POA: Diagnosis not present

## 2020-04-03 DIAGNOSIS — R32 Unspecified urinary incontinence: Secondary | ICD-10-CM | POA: Diagnosis not present

## 2020-04-03 DIAGNOSIS — R69 Illness, unspecified: Secondary | ICD-10-CM | POA: Diagnosis not present

## 2020-04-03 DIAGNOSIS — I7300 Raynaud's syndrome without gangrene: Secondary | ICD-10-CM | POA: Diagnosis not present

## 2020-04-06 DIAGNOSIS — R319 Hematuria, unspecified: Secondary | ICD-10-CM | POA: Diagnosis not present

## 2020-04-06 DIAGNOSIS — Z6826 Body mass index (BMI) 26.0-26.9, adult: Secondary | ICD-10-CM | POA: Diagnosis not present

## 2020-04-06 DIAGNOSIS — Z23 Encounter for immunization: Secondary | ICD-10-CM | POA: Diagnosis not present

## 2020-04-06 DIAGNOSIS — H9311 Tinnitus, right ear: Secondary | ICD-10-CM | POA: Diagnosis not present

## 2020-04-06 DIAGNOSIS — R7303 Prediabetes: Secondary | ICD-10-CM | POA: Diagnosis not present

## 2020-04-06 DIAGNOSIS — H9319 Tinnitus, unspecified ear: Secondary | ICD-10-CM | POA: Diagnosis not present

## 2020-04-06 DIAGNOSIS — H26499 Other secondary cataract, unspecified eye: Secondary | ICD-10-CM | POA: Diagnosis not present

## 2020-04-06 DIAGNOSIS — I7300 Raynaud's syndrome without gangrene: Secondary | ICD-10-CM | POA: Diagnosis not present

## 2020-04-06 DIAGNOSIS — R55 Syncope and collapse: Secondary | ICD-10-CM | POA: Diagnosis not present

## 2020-04-06 DIAGNOSIS — G3184 Mild cognitive impairment, so stated: Secondary | ICD-10-CM | POA: Diagnosis not present

## 2020-04-10 DIAGNOSIS — R319 Hematuria, unspecified: Secondary | ICD-10-CM | POA: Diagnosis not present

## 2020-04-10 DIAGNOSIS — I10 Essential (primary) hypertension: Secondary | ICD-10-CM | POA: Diagnosis not present

## 2020-04-10 DIAGNOSIS — E782 Mixed hyperlipidemia: Secondary | ICD-10-CM | POA: Diagnosis not present

## 2020-04-10 DIAGNOSIS — R69 Illness, unspecified: Secondary | ICD-10-CM | POA: Diagnosis not present

## 2020-04-10 DIAGNOSIS — G3184 Mild cognitive impairment, so stated: Secondary | ICD-10-CM | POA: Diagnosis not present

## 2020-04-10 DIAGNOSIS — K219 Gastro-esophageal reflux disease without esophagitis: Secondary | ICD-10-CM | POA: Diagnosis not present

## 2020-04-10 DIAGNOSIS — I7300 Raynaud's syndrome without gangrene: Secondary | ICD-10-CM | POA: Diagnosis not present

## 2020-04-10 DIAGNOSIS — M810 Age-related osteoporosis without current pathological fracture: Secondary | ICD-10-CM | POA: Diagnosis not present

## 2020-05-03 DIAGNOSIS — I499 Cardiac arrhythmia, unspecified: Secondary | ICD-10-CM | POA: Diagnosis not present

## 2020-05-03 DIAGNOSIS — I10 Essential (primary) hypertension: Secondary | ICD-10-CM | POA: Diagnosis not present

## 2020-05-03 DIAGNOSIS — K219 Gastro-esophageal reflux disease without esophagitis: Secondary | ICD-10-CM | POA: Diagnosis not present

## 2020-05-03 DIAGNOSIS — G3184 Mild cognitive impairment, so stated: Secondary | ICD-10-CM | POA: Diagnosis not present

## 2020-05-03 DIAGNOSIS — I7300 Raynaud's syndrome without gangrene: Secondary | ICD-10-CM | POA: Diagnosis not present

## 2020-05-03 DIAGNOSIS — E782 Mixed hyperlipidemia: Secondary | ICD-10-CM | POA: Diagnosis not present

## 2020-05-03 DIAGNOSIS — R69 Illness, unspecified: Secondary | ICD-10-CM | POA: Diagnosis not present

## 2020-05-03 DIAGNOSIS — M810 Age-related osteoporosis without current pathological fracture: Secondary | ICD-10-CM | POA: Diagnosis not present

## 2020-06-04 DIAGNOSIS — M1990 Unspecified osteoarthritis, unspecified site: Secondary | ICD-10-CM | POA: Diagnosis not present

## 2020-06-04 DIAGNOSIS — I10 Essential (primary) hypertension: Secondary | ICD-10-CM | POA: Diagnosis not present

## 2020-06-06 ENCOUNTER — Ambulatory Visit: Payer: Medicare HMO | Admitting: Cardiology

## 2020-06-29 DIAGNOSIS — Z23 Encounter for immunization: Secondary | ICD-10-CM | POA: Diagnosis not present

## 2020-07-07 DIAGNOSIS — I10 Essential (primary) hypertension: Secondary | ICD-10-CM | POA: Diagnosis not present

## 2020-07-07 DIAGNOSIS — R7303 Prediabetes: Secondary | ICD-10-CM | POA: Diagnosis not present

## 2020-07-07 DIAGNOSIS — E782 Mixed hyperlipidemia: Secondary | ICD-10-CM | POA: Diagnosis not present

## 2020-07-11 DIAGNOSIS — I10 Essential (primary) hypertension: Secondary | ICD-10-CM | POA: Diagnosis not present

## 2020-07-11 DIAGNOSIS — K219 Gastro-esophageal reflux disease without esophagitis: Secondary | ICD-10-CM | POA: Diagnosis not present

## 2020-07-11 DIAGNOSIS — R32 Unspecified urinary incontinence: Secondary | ICD-10-CM | POA: Diagnosis not present

## 2020-07-11 DIAGNOSIS — I7300 Raynaud's syndrome without gangrene: Secondary | ICD-10-CM | POA: Diagnosis not present

## 2020-07-11 DIAGNOSIS — E782 Mixed hyperlipidemia: Secondary | ICD-10-CM | POA: Diagnosis not present

## 2020-07-11 DIAGNOSIS — R7301 Impaired fasting glucose: Secondary | ICD-10-CM | POA: Diagnosis not present

## 2020-07-11 DIAGNOSIS — R69 Illness, unspecified: Secondary | ICD-10-CM | POA: Diagnosis not present

## 2020-07-11 DIAGNOSIS — M810 Age-related osteoporosis without current pathological fracture: Secondary | ICD-10-CM | POA: Diagnosis not present

## 2020-07-11 DIAGNOSIS — R419 Unspecified symptoms and signs involving cognitive functions and awareness: Secondary | ICD-10-CM | POA: Diagnosis not present

## 2020-07-14 ENCOUNTER — Encounter: Payer: Self-pay | Admitting: Cardiology

## 2020-10-04 DIAGNOSIS — I10 Essential (primary) hypertension: Secondary | ICD-10-CM | POA: Diagnosis not present

## 2020-10-04 DIAGNOSIS — M1990 Unspecified osteoarthritis, unspecified site: Secondary | ICD-10-CM | POA: Diagnosis not present

## 2020-11-03 DIAGNOSIS — I10 Essential (primary) hypertension: Secondary | ICD-10-CM | POA: Diagnosis not present

## 2020-11-03 DIAGNOSIS — E1165 Type 2 diabetes mellitus with hyperglycemia: Secondary | ICD-10-CM | POA: Diagnosis not present

## 2020-11-16 DIAGNOSIS — Z23 Encounter for immunization: Secondary | ICD-10-CM | POA: Diagnosis not present

## 2020-12-04 DIAGNOSIS — M1990 Unspecified osteoarthritis, unspecified site: Secondary | ICD-10-CM | POA: Diagnosis not present

## 2020-12-04 DIAGNOSIS — I10 Essential (primary) hypertension: Secondary | ICD-10-CM | POA: Diagnosis not present

## 2021-01-03 DIAGNOSIS — I10 Essential (primary) hypertension: Secondary | ICD-10-CM | POA: Diagnosis not present

## 2021-01-03 DIAGNOSIS — M1990 Unspecified osteoarthritis, unspecified site: Secondary | ICD-10-CM | POA: Diagnosis not present

## 2021-03-29 ENCOUNTER — Encounter: Payer: Self-pay | Admitting: *Deleted

## 2021-03-29 ENCOUNTER — Encounter: Payer: Self-pay | Admitting: Cardiology

## 2021-03-29 ENCOUNTER — Ambulatory Visit: Payer: Medicare HMO | Admitting: Cardiology

## 2021-03-29 VITALS — BP 134/80 | HR 60 | Ht 59.0 in | Wt 161.8 lb

## 2021-03-29 DIAGNOSIS — I471 Supraventricular tachycardia: Secondary | ICD-10-CM | POA: Diagnosis not present

## 2021-03-29 DIAGNOSIS — R5383 Other fatigue: Secondary | ICD-10-CM | POA: Diagnosis not present

## 2021-03-29 DIAGNOSIS — R2681 Unsteadiness on feet: Secondary | ICD-10-CM

## 2021-03-29 NOTE — Patient Instructions (Signed)
Medication Instructions:  Continue all current medications.  Labwork: none  Testing/Procedures: none  Follow-Up: Your physician wants you to follow up in:  1 year.  You will receive a reminder letter in the mail one-two months in advance.  If you don't receive a letter, please call our office to schedule the follow up appointment    Any Other Special Instructions Will Be Listed Below (If Applicable). You have been referred to:  physical therapy at [facility]   If you need a refill on your cardiac medications before your next appointment, please call your pharmacy.

## 2021-03-29 NOTE — Progress Notes (Signed)
Clinical Summary [name] is a 76 y.o.female seen today for follow up of the following medical problems.    1. Syncope - seen in ER 01/24/17 with syncope. Isolated episode - occurred at home. Occurred while standing. She had not felt well that day. Started having some abdominal pain. Sat on commode, felt very sweaty. Stood up and walked to kitchen. Put dinner in microwave. Next thing she knows was laying on the floor next to fridge - no prior syncopal events - no significant orthostatic symptoms  - recent rapid weight loss - Jan 2019 carotid US without significant disease - CT head no intracranial process - was orthostatic at prior clinic visit    - no recurrent syncope - some balance issues and falls but she reports these are not related to feeling lightheaded or dizzy.    2. Fatigue - benign echo - was not able to get transportation for a coronary CTA that had been ordered  -01/2020 echo LVEF 60-65%, no WMAs, normal RV function - dealing with long COVID, memory deficits/brain fog.  - no chest pains.   3.PSVT - - Recent ZIO monitor showed predominantly normal sinus rhythm with an average heart rate of 61 bpm. She did have runs of SVT with the longest lasting for 30.9 seconds. - no palpitations.    4.Hyperlipidemia - labs followed by pcp - she is on rosuvastatin 10mg  daily Past Medical History:  Diagnosis Date   Acid reflux    Anxiety    Cognitive impairment    Depression    Dizzy spells    GERD (gastroesophageal reflux disease)    Hyperlipidemia    Hypertension    Osteoarthritis    Osteoporosis    Raynaud's disease without gangrene      No Known Allergies   Current Outpatient Medications  Medication Sig Dispense Refill   acetaminophen (TYLENOL) 500 MG tablet Take 500 mg by mouth 2 (two) times daily.     Alpha-Lipoic Acid 600 MG CAPS Take 600 mg by mouth daily.     Cholecalciferol (VITAMIN D3 PO) Take 15,000 Units by mouth daily.      Docosahexaenoic Acid (DHA PO) Take 1,000 mg by mouth daily.     escitalopram (LEXAPRO) 10 MG tablet Take 10 mg by mouth daily.     glucosamine-chondroitin 500-400 MG tablet Take 1 tablet by mouth 2 (two) times daily.     Magnesium 250 MG TABS Take 1 tablet by mouth daily.     Menaquinone-7 (VITAMIN K2) 100 MCG CAPS Take 100 mcg by mouth daily.     Methylsulfonylmethane (MSM) 1000 MG CAPS Take 1 capsule by mouth daily.     Probiotic Product (PROBIOTIC DAILY PO) Take 1 tablet by mouth daily.     traMADol (ULTRAM) 50 MG tablet Take 50 mg by mouth as needed.     UNABLE TO FIND Med Name: tumeric 350 mg 2 x daily     No current facility-administered medications for this visit.     Past Surgical History:  Procedure Laterality Date   CATARACT EXTRACTION W/PHACO Left 10/12/2012   Procedure: CATARACT EXTRACTION PHACO AND INTRAOCULAR LENS PLACEMENT (IOC);  Surgeon: Tonny , MD;  Location: [facility];  Service: Ophthalmology;  Laterality: Left;  CDE:16.97   CRANIOTOMY Left 06/09/2012   Procedure: CRANIOTOMY HEMATOMA EVACUATION SUBDURAL;  Surgeon: Floyce Stakes, MD;  Location: [facility];  Service: Neurosurgery;  Laterality: Left;   RHINOPLASTY       No Known Allergies  Family History  Problem Relation Age of Onset   Ulcerative colitis Mother    Thyroid disease Mother        goiter   Lung cancer Father    Cardiomyopathy Sister        takotsubu   Hyperlipidemia Sister    Hypertension Sister    Rheum arthritis Sister    Thyroid disease Maternal Grandfather        died during operation for a goiter   Other Paternal Grandmother        flu epidemic   Other Paternal Grandfather        flu epidemic     Social History [name] reports that she has never smoked. She has never used smokeless tobacco. [name] reports no history of alcohol use.   Review of Systems CONSTITUTIONAL: + fatigue HEENT: Eyes: No visual loss, blurred vision, double vision or yellow sclerae.No  hearing loss, sneezing, congestion, runny nose or sore throat.  SKIN: No rash or itching.  CARDIOVASCULAR: per hpi RESPIRATORY: per hpi GASTROINTESTINAL: No anorexia, nausea, vomiting or diarrhea. No abdominal pain or blood.  GENITOURINARY: No burning on urination, no polyuria NEUROLOGICAL: No headache, dizziness, syncope, paralysis, ataxia, numbness or tingling in the extremities. No change in bowel or bladder control.  MUSCULOSKELETAL: No muscle, back pain, joint pain or stiffness.  LYMPHATICS: No enlarged nodes. No history of splenectomy.  PSYCHIATRIC: No history of depression or anxiety.  ENDOCRINOLOGIC: No reports of sweating, cold or heat intolerance. No polyuria or polydipsia.  Marland Kitchen   Physical Examination Today's Vitals   03/29/21 1034  BP: 134/80  Pulse: 60  SpO2: 98%  Weight: 161 lb 12.8 oz (73.4 kg)  Height: 4\' 11"  (1.499 m)   Body mass index is 32.68 kg/m.  Gen: resting comfortably, no acute distress HEENT: no scleral icterus, pupils equal round and reactive, no palptable cervical adenopathy,  CV: RRR, no m/r/g no jvd Resp: Clear to auscultation bilaterally GI: abdomen is soft, non-tender, non-distended, normal bowel sounds, no hepatosplenomegaly MSK: extremities are warm, no edema.  Skin: warm, no rash Neuro:  no focal deficits Psych: appropriate affect   Diagnostic Studies 01/2020 echo IMPRESSIONS     1. Left ventricular ejection fraction, by estimation, is 60 to 65%. The  left ventricle has normal function. The left ventricle has no regional  wall motion abnormalities. Left ventricular diastolic parameters are  indeterminate.   2. Right ventricular systolic function is normal. The right ventricular  size is normal. Tricuspid regurgitation signal is inadequate for assessing  PA pressure.   3. The mitral valve is grossly normal. Trivial mitral valve  regurgitation.   4. The aortic valve is tricuspid. Aortic valve regurgitation is not  visualized.   5. The  inferior vena cava is normal in size with greater than 50%  respiratory variability, suggesting right atrial pressure of 3 mmHg.    12/2019 heart monitor  14 day monitor Overall rare supraventicular and ventricular ectopy. Occasional runs of SVT longest up to 31 seconds No symptoms reported   Patient had a min HR of 41 bpm, max HR of 182 bpm, and avg HR of 61 bpm. Predominant underlying rhythm was Sinus Rhythm. 4073 Supraventricular Tachycardia runs occurred, the run with the fastest interval lasting 7 beats with a max rate of 182 bpm, the longest lasting 30.9 secs with an avg rate of 132 bpm. Isolated SVEs were rare (<1.0%), SVE Couplets were rare (<1.0%), and SVE Triplets were rare (<1.0%). Isolated VEs were rare (<  1.0%), VE Couplets were rare (<1.0%), and no VE Triplets were present. Ventricular Trigeminy was present.  Assessment and Plan   1. Fatigue - priimarily generalized fatigue, she reports has been told she may have long covid - no specific cardiopulmonary symptoms, prior echo was benign -no indication for further cardiac testing  2. Unstable gait - denies lightheadedness or dizziness, just unsteady balance with prior falls - refer to PT for evaluation  3. PSVT - asymptomatic, no indciation for av nodal agent at this time in absence of symptoms, particularly with prior orthostatic bp changes  F/u 1 year   Arnoldo Lenis, M.D.

## 2021-08-28 ENCOUNTER — Encounter: Payer: Self-pay | Admitting: Family Medicine

## 2021-08-28 ENCOUNTER — Ambulatory Visit (INDEPENDENT_AMBULATORY_CARE_PROVIDER_SITE_OTHER): Payer: Medicare HMO | Admitting: Family Medicine

## 2021-08-28 VITALS — BP 114/73 | HR 72 | Temp 97.7°F | Ht 59.0 in | Wt 148.2 lb

## 2021-08-28 DIAGNOSIS — E785 Hyperlipidemia, unspecified: Secondary | ICD-10-CM | POA: Insufficient documentation

## 2021-08-28 DIAGNOSIS — R42 Dizziness and giddiness: Secondary | ICD-10-CM | POA: Diagnosis not present

## 2021-08-28 DIAGNOSIS — R413 Other amnesia: Secondary | ICD-10-CM

## 2021-08-28 DIAGNOSIS — U099 Post covid-19 condition, unspecified: Secondary | ICD-10-CM | POA: Diagnosis not present

## 2021-08-28 DIAGNOSIS — E7800 Pure hypercholesterolemia, unspecified: Secondary | ICD-10-CM

## 2021-08-28 DIAGNOSIS — F419 Anxiety disorder, unspecified: Secondary | ICD-10-CM

## 2021-08-28 DIAGNOSIS — M810 Age-related osteoporosis without current pathological fracture: Secondary | ICD-10-CM

## 2021-08-28 DIAGNOSIS — R5382 Chronic fatigue, unspecified: Secondary | ICD-10-CM | POA: Diagnosis not present

## 2021-08-28 DIAGNOSIS — R2990 Unspecified symptoms and signs involving the nervous system: Secondary | ICD-10-CM | POA: Diagnosis not present

## 2021-08-28 DIAGNOSIS — E663 Overweight: Secondary | ICD-10-CM | POA: Diagnosis not present

## 2021-08-28 DIAGNOSIS — F32A Depression, unspecified: Secondary | ICD-10-CM

## 2021-08-28 DIAGNOSIS — R69 Illness, unspecified: Secondary | ICD-10-CM | POA: Diagnosis not present

## 2021-08-28 MED ORDER — ESCITALOPRAM OXALATE 20 MG PO TABS: 20.0000 mg | ORAL_TABLET | Freq: Every day | ORAL | 3 refills | Status: DC

## 2021-08-28 NOTE — Patient Instructions (Signed)
Labs ordered.  MRI ordered as well. We will be in touch to schedule.   Follow up in 3 months.  Take care  Dr. Lacinda Axon

## 2021-08-29 DIAGNOSIS — R42 Dizziness and giddiness: Secondary | ICD-10-CM | POA: Insufficient documentation

## 2021-08-29 DIAGNOSIS — R413 Other amnesia: Secondary | ICD-10-CM | POA: Insufficient documentation

## 2021-08-29 DIAGNOSIS — E663 Overweight: Secondary | ICD-10-CM | POA: Insufficient documentation

## 2021-08-29 DIAGNOSIS — M810 Age-related osteoporosis without current pathological fracture: Secondary | ICD-10-CM | POA: Insufficient documentation

## 2021-08-29 DIAGNOSIS — R5382 Chronic fatigue, unspecified: Secondary | ICD-10-CM | POA: Insufficient documentation

## 2021-08-29 DIAGNOSIS — R2990 Unspecified symptoms and signs involving the nervous system: Secondary | ICD-10-CM | POA: Insufficient documentation

## 2021-08-29 DIAGNOSIS — F32A Depression, unspecified: Secondary | ICD-10-CM | POA: Insufficient documentation

## 2021-08-29 NOTE — Assessment & Plan Note (Signed)
Increasing Lexapro to 20 mg daily.

## 2021-08-29 NOTE — Progress Notes (Signed)
Subjective:  Patient ID: Jenna Booth, female    DOB: Jun 23, 1945  Age: 76 y.o. MRN: [id number]  CC: Chief Complaint  Patient presents with   Establish Care    Pt has been told that she has "long Covid". Pt having severe fatigue, dizziness, unsteady on feet, brain fog, forgetfulness of frequently used words, cough. Pt report severe depression since losing husband on 01/29/12. Pt reports lots of family stress. [place] and broke nose recently Pt lost cat on Jun 11 2021. Pt reports not being able to swallow when trying to eat chicken-happened about one month ago. Pt began to spit up mucus and unable to swallow for 3 hours.     HPI:  76 year old female with hyperlipidemia, reported long COVID, osteoarthritis, osteoporosis (not on treatment), depression presents to establish care.  Patient reports ongoing with fatigue, dizziness, brain fog, and memory issues since having COVID-19 in 2020.  Patient also has longstanding depression.  Still struggles with this.  Lost her husband in 2013 which has been a major contributing factor.  Has been seen by neurology but did not follow-up and did not get recommended MRI imaging.  Patient has osteoarthritis and uses Tylenol as needed.  Has known osteoporosis.  For some reason is not on treatment.  I believe that this is due to her reluctance to do so.  Patient states that she continues to be bothered by depression.  She is taking Lexapro 10 mg daily.  She would like to discuss refilling this medication oral altering this medication today.  PHQ-9 score 14.  GAD-7 score of 9.   Social Hx   Social History   Socioeconomic History   Marital status: Widowed    Spouse name: Not on file   Number of children: 0   Years of education: Not on file   Highest education level: Some college, no degree  Occupational History    Comment: retired  Tobacco Use   Smoking status: Never   Smokeless tobacco: Never  Vaping Use   Vaping Use: Never used  Substance and  Sexual Activity   Alcohol use: Never   Drug use: Never   Sexual activity: Not on file  Other Topics Concern   Not on file  Social History Narrative   01/18/19 lives alone   Caffeine- coffee 1 c daily   Social Determinants of Health   Financial Resource Strain: Not on file  Food Insecurity: Not on file  Transportation Needs: Not on file  Physical Activity: Not on file  Stress: Not on file  Social Connections: Not on file   Review of Systems Per HPI  Objective:  BP 114/73   Pulse 72   Temp 97.7 F (36.5 C)   Ht _0  (1.499 m)   Wt 148 lb 3.2 oz (67.2 kg)   SpO2 97%   BMI 29.93 kg/m      08/28/2021    1:52 PM 03/29/2021   10:34 AM 02/01/2020    2:01 PM  BP/Weight  Systolic BP 092 330 076  Diastolic BP 73 80 78  Wt. (Lbs) 148.2 161.8 129  BMI 29.93 kg/m2 32.68 kg/m2 25.19 kg/m2    Physical Exam Vitals and nursing note reviewed.  Constitutional:      General: She is not in acute distress.    Appearance: She is not ill-appearing.  HENT:     Head: Normocephalic and atraumatic.  Eyes:     General:        Right eye: No discharge.  Left eye: No discharge.     Conjunctiva/sclera: Conjunctivae normal.  Cardiovascular:     Rate and Rhythm: Normal rate and regular rhythm.  Pulmonary:     Effort: Pulmonary effort is normal.     Breath sounds: Normal breath sounds. No wheezing, rhonchi or rales.  Abdominal:     General: There is no distension.     Palpations: Abdomen is soft.  Neurological:     Mental Status: She is alert.  Psychiatric:        Mood and Affect: Mood normal.        Behavior: Behavior normal.     Lab Results  Component Value Date   WBC 11.2 (H) 01/24/2017   HGB 12.5 01/24/2017   HCT 38.9 01/24/2017   PLT 172 01/24/2017   GLUCOSE 190 (H) 01/24/2017   ALT 19 01/24/2017   AST 28 01/24/2017   NA 142 01/24/2017   K 4.1 01/24/2017   CL 105 01/24/2017   CREATININE 0.91 01/24/2017   BUN 34 (H) 01/24/2017   CO2 24 01/24/2017   TSH  2.630 04/10/2017   INR 1.00 06/09/2012     Assessment & Plan:   Problem List Items Addressed This Visit       Musculoskeletal and Integument   Osteoporosis    Not currently on treatment.  Will discuss at follow-up.  Awaiting labs.        Other   Anxiety and depression    Increasing Lexapro to 20 mg daily.      Relevant Medications   escitalopram (LEXAPRO) 20 MG tablet   Chronic fatigue - Primary    This is likely multifactorial.  I suspect that depression is playing a role.      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  This has previously been recommended by neurology.      Relevant Orders   MR Brain Wo Contrast   Hyperlipidemia    Lipid panel to assess.      Relevant Orders   Lipid panel   Memory difficulties   Relevant Orders   MR Brain Wo Contrast   Overweight (BMI 25.0-29.9)   Relevant Orders   CMP14+EGFR    Meds ordered this encounter  Medications   escitalopram (LEXAPRO) 20 MG tablet    Sig: Take 1 tablet (20 mg total) by mouth daily.    Dispense:  90 tablet    Refill:  3    Follow-up:  Return in about 3 months (around 11/28/2021).  [place]

## 2021-08-29 NOTE — Assessment & Plan Note (Signed)
This is likely multifactorial.  I suspect that depression is playing a role.

## 2021-08-29 NOTE — Assessment & Plan Note (Signed)
Not currently on treatment.  Will discuss at follow-up.  Awaiting labs.

## 2021-08-29 NOTE — Assessment & Plan Note (Signed)
Lipid panel to assess.

## 2021-08-29 NOTE — Assessment & Plan Note (Signed)
Given patient's dizziness and memory difficulties, recommend MRI imaging.  This has previously been recommended by neurology.

## 2021-09-19 ENCOUNTER — Ambulatory Visit (HOSPITAL_COMMUNITY)
Admission: RE | Admit: 2021-09-19 | Discharge: 2021-09-19 | Disposition: A | Discharge status: Home / Self Care | Payer: Medicare HMO | Source: Ambulatory Visit | Attending: Family Medicine | Admitting: Family Medicine

## 2021-09-19 DIAGNOSIS — R42 Dizziness and giddiness: Secondary | ICD-10-CM

## 2021-09-19 DIAGNOSIS — R413 Other amnesia: Secondary | ICD-10-CM | POA: Diagnosis not present

## 2021-10-18 ENCOUNTER — Other Ambulatory Visit: Payer: Self-pay

## 2021-10-18 ENCOUNTER — Emergency Department (HOSPITAL_COMMUNITY): Payer: Medicare HMO

## 2021-10-18 ENCOUNTER — Encounter (HOSPITAL_COMMUNITY): Payer: Self-pay | Admitting: Emergency Medicine

## 2021-10-18 ENCOUNTER — Ambulatory Visit (HOSPITAL_COMMUNITY)
Admission: EM | Admit: 2021-10-18 | Discharge: 2021-10-19 | Disposition: A | Discharge status: Home / Self Care | Payer: Medicare HMO | Attending: Emergency Medicine | Admitting: Emergency Medicine

## 2021-10-18 DIAGNOSIS — F419 Anxiety disorder, unspecified: Secondary | ICD-10-CM | POA: Diagnosis not present

## 2021-10-18 DIAGNOSIS — T18128A Food in esophagus causing other injury, initial encounter: Secondary | ICD-10-CM | POA: Insufficient documentation

## 2021-10-18 DIAGNOSIS — R69 Illness, unspecified: Secondary | ICD-10-CM | POA: Diagnosis not present

## 2021-10-18 DIAGNOSIS — Z79899 Other long term (current) drug therapy: Secondary | ICD-10-CM | POA: Diagnosis not present

## 2021-10-18 DIAGNOSIS — X58XXXA Exposure to other specified factors, initial encounter: Secondary | ICD-10-CM | POA: Insufficient documentation

## 2021-10-18 DIAGNOSIS — K222 Esophageal obstruction: Secondary | ICD-10-CM | POA: Diagnosis not present

## 2021-10-18 DIAGNOSIS — F32A Depression, unspecified: Secondary | ICD-10-CM | POA: Insufficient documentation

## 2021-10-18 DIAGNOSIS — F418 Other specified anxiety disorders: Secondary | ICD-10-CM | POA: Diagnosis not present

## 2021-10-18 DIAGNOSIS — R1110 Vomiting, unspecified: Secondary | ICD-10-CM | POA: Diagnosis not present

## 2021-10-18 DIAGNOSIS — R9431 Abnormal electrocardiogram [ECG] [EKG]: Secondary | ICD-10-CM | POA: Diagnosis not present

## 2021-10-18 DIAGNOSIS — Z20822 Contact with and (suspected) exposure to covid-19: Secondary | ICD-10-CM | POA: Diagnosis not present

## 2021-10-18 DIAGNOSIS — R0789 Other chest pain: Secondary | ICD-10-CM | POA: Diagnosis not present

## 2021-10-18 DIAGNOSIS — I10 Essential (primary) hypertension: Secondary | ICD-10-CM | POA: Diagnosis not present

## 2021-10-18 DIAGNOSIS — M1990 Unspecified osteoarthritis, unspecified site: Secondary | ICD-10-CM | POA: Insufficient documentation

## 2021-10-18 DIAGNOSIS — M5030 Other cervical disc degeneration, unspecified cervical region: Secondary | ICD-10-CM | POA: Diagnosis not present

## 2021-10-18 DIAGNOSIS — R531 Weakness: Secondary | ICD-10-CM | POA: Diagnosis not present

## 2021-10-18 DIAGNOSIS — R1310 Dysphagia, unspecified: Secondary | ICD-10-CM | POA: Diagnosis not present

## 2021-10-18 LAB — RESP PANEL BY RT-PCR (FLU A&B, COVID) ARPGX2
Influenza A by PCR: NEGATIVE
Influenza B by PCR: NEGATIVE
SARS Coronavirus 2 by RT PCR: NEGATIVE

## 2021-10-18 LAB — COMPREHENSIVE METABOLIC PANEL
ALT: 16 U/L (ref 0–44)
AST: 19 U/L (ref 15–41)
Albumin: 4.9 g/dL (ref 3.5–5.0)
Alkaline Phosphatase: 45 U/L (ref 38–126)
Anion gap: 11 (ref 5–15)
BUN: 28 mg/dL — ABNORMAL HIGH (ref 8–23)
CO2: 21 mmol/L — ABNORMAL LOW (ref 22–32)
Calcium: 9.9 mg/dL (ref 8.9–10.3)
Chloride: 106 mmol/L (ref 98–111)
Creatinine, Ser: 0.65 mg/dL (ref 0.44–1.00)
GFR, Estimated: >60 mL/min (ref >60)
Glucose, Bld: 96 mg/dL (ref 70–99)
Potassium: 4.3 mmol/L (ref 3.5–5.1)
Sodium: 138 mmol/L (ref 135–145)
Total Bilirubin: 1 mg/dL (ref 0.3–1.2)
Total Protein: 7.9 g/dL (ref 6.5–8.1)

## 2021-10-18 LAB — CBC WITH DIFFERENTIAL/PLATELET
Abs Immature Granulocytes: 0.01 10*3/uL (ref 0.00–0.07)
Basophils Absolute: 0 10*3/uL (ref 0.0–0.1)
Basophils Relative: 1 %
Eosinophils Absolute: 0.1 10*3/uL (ref 0.0–0.5)
Eosinophils Relative: 1 %
HCT: 41.3 % (ref 36.0–46.0)
Hemoglobin: 13.6 g/dL (ref 12.0–15.0)
Immature Granulocytes: 0 %
Lymphocytes Relative: 32 %
Lymphs Abs: 2.3 10*3/uL (ref 0.7–4.0)
MCH: 32.2 pg (ref 26.0–34.0)
MCHC: 32.9 g/dL (ref 30.0–36.0)
MCV: 97.6 fL (ref 80.0–100.0)
Monocytes Absolute: 0.5 10*3/uL (ref 0.1–1.0)
Monocytes Relative: 7 %
Neutro Abs: 4.3 10*3/uL (ref 1.7–7.7)
Neutrophils Relative %: 59 %
Platelets: 177 10*3/uL (ref 150–400)
RBC: 4.23 MIL/uL (ref 3.87–5.11)
RDW: 13.2 % (ref 11.5–15.5)
WBC: 7.1 10*3/uL (ref 4.0–10.5)
nRBC: 0 % (ref 0.0–0.2)

## 2021-10-18 MED ORDER — GLUCAGON HCL RDNA (DIAGNOSTIC) 1 MG IJ SOLR
1.0000 mg | Freq: Once | INTRAMUSCULAR | Status: AC
  Administered 2021-10-18: 1 mg via INTRAVENOUS
  Filled 2021-10-18: qty 1

## 2021-10-18 MED ORDER — IOHEXOL 300 MG/ML  SOLN
75.0000 mL | Freq: Once | INTRAMUSCULAR | Status: AC | PRN
  Administered 2021-10-18: 75 mL via INTRAVENOUS

## 2021-10-18 NOTE — ED Provider Notes (Signed)
[facility] EMERGENCY DEPARTMENT Provider Note   CSN: [id number] Arrival date & time: 10/18/21  1333     History  Chief Complaint  Patient presents with   Food BOLUS    Jenna Booth is a 76 y.o. female.  Pt complains of thick mucus draining from her nose.  Pt reports she has thick mucus in her throat.  Pt reports she has been unable to keep any fluids down.  Pt reports she vomits when she drinks.  Pt denies fever or chills.  She denies any exposure to illness.    The history is provided by the patient. No language interpreter was used.       Home Medications Prior to Admission medications   Medication Sig Start Date End Date Taking? Authorizing Provider  acetaminophen (TYLENOL) 500 MG tablet Take 500 mg by mouth 2 (two) times daily.    [provider]  Cholecalciferol (VITAMIN D3 PO) Take 5,000 Units by mouth daily.    [provider]  escitalopram (LEXAPRO) 20 MG tablet Take 1 tablet (20 mg total) by mouth daily. 08/28/21   Coral Spikes, DO  glucosamine-chondroitin 500-400 MG tablet Take 1 tablet by mouth 2 (two) times daily.    [provider]  loratadine (CLARITIN) 10 MG tablet Take 10 mg by mouth daily.    [provider]  Magnesium 250 MG TABS Take 1 tablet by mouth daily.    [provider]  OVER THE COUNTER MEDICATION Take 450 mg by mouth 2 (two) times daily. Ashwagandha    [provider]  Probiotic Product (PROBIOTIC DAILY PO) Take 1 tablet by mouth daily.    [provider]      Allergies    Patient has no known allergies.    Review of Systems   Review of Systems  All other systems reviewed and are negative.   Physical Exam Updated Vital Signs BP 131/75 (BP Location: Right Arm)   Pulse 61   Temp 98.2 F (36.8 C)   Resp 14   Ht '4\' 11"'$  (1.499 m)   Wt 67.2 kg   SpO2 100%   BMI 29.92 kg/m  Physical Exam Vitals reviewed.  Cardiovascular:     Rate and Rhythm: Normal rate.  Pulmonary:      Effort: Pulmonary effort is normal.  Abdominal:     General: Abdomen is flat.  Skin:    General: Skin is warm.  Neurological:     General: No focal deficit present.     Mental Status: She is alert.  Psychiatric:        Mood and Affect: Mood normal.     ED Results / Procedures / Treatments   Labs (all labs ordered are listed, but only abnormal results are displayed) Labs Reviewed  COMPREHENSIVE METABOLIC PANEL - Abnormal; Notable for the following components:      Result Value   CO2 21 (*)    BUN 28 (*)    All other components within normal limits  RESP PANEL BY RT-PCR (FLU A&B, COVID) ARPGX2  CBC WITH DIFFERENTIAL/PLATELET    EKG EKG Interpretation  Date/Time:  Thursday October 18 2021 14:38:57 EDT Ventricular Rate:  68 PR Interval:  146 QRS Duration: 82 QT Interval:  394 QTC Calculation: 418 R Axis:   -20 Text Interpretation: Normal sinus rhythm Anterior infarct , age undetermined Abnormal ECG When compared with ECG of 24-Jan-2017 17:41, PREVIOUS ECG IS PRESENT Confirmed by Margaretmary Eddy [phone]) on 10/18/2021 5:45:21 PM  Radiology CT Soft Tissue Neck W Contrast  Result Date: 10/18/2021 CLINICAL DATA:  Palate weakness (CN 9). Nasal drainage with difficulty swallowing and vomiting. EXAM: CT NECK WITH CONTRAST TECHNIQUE: Multidetector CT imaging of the neck was performed using the standard protocol following the bolus administration of intravenous contrast. RADIATION DOSE REDUCTION: This exam was performed according to the departmental dose-optimization program which includes automated exposure control, adjustment of the mA and/or kV according to patient size and/or use of iterative reconstruction technique. CONTRAST:  103m OMNIPAQUE IOHEXOL 300 MG/ML  SOLN COMPARISON:  CT head and maxillofacial 01/21/2019 FINDINGS: Pharynx and larynx: No evidence of mass or swelling. Widely patent airway. No fluid collection or inflammatory changes in the parapharyngeal or  retropharyngeal spaces. Salivary glands: No inflammation, mass, or stone. Thyroid: Unremarkable. Lymph nodes: No enlarged or suspicious lymph nodes in the neck. Vascular: Major vascular structures of the neck appear patent. Partially retropharyngeal course of the internal carotid arteries. Limited intracranial: Unremarkable. Visualized orbits: Only the inferior most aspects of the orbits were imaged and are unremarkable. Mastoids and visualized paranasal sinuses: Partially imaged opacification of a posterior left ethmoid air cell. Clear mastoid air cells. Skeleton: Mild to moderate disc and advanced facet degeneration in the cervical spine. Facet and partial interbody ankylosis at C3-4. Upper chest: Clear lung apices. Distension of the included proximal esophagus by fluid and gas. Other: None. IMPRESSION: 1. No acute abnormality or mass in the neck. 2. Distended esophagus containing fluid which could reflect dysmotility or reflux. Electronically Signed   By: ALogan BoresM.D.   On: 10/18/2021 19:31   DG Chest Portable 1 View  Result Date: 10/18/2021 CLINICAL DATA:  Chest discomfort. EXAM: PORTABLE CHEST 1 VIEW COMPARISON:  January 24, 2017. FINDINGS: The heart size and mediastinal contours are within normal limits. Both lungs are clear. The visualized skeletal structures are unremarkable. IMPRESSION: No active disease. Electronically Signed   By: JMarijo ConceptionM.D.   On: 10/18/2021 14:58    Procedures Procedures    Medications Ordered in ED Medications  iohexol (OMNIPAQUE) 300 MG/ML solution 75 mL (75 mLs Intravenous Contrast Given 10/18/21 1904)  glucagon (human recombinant) (GLUCAGEN) injection 1 mg (1 mg Intravenous Given 10/18/21 2232)    ED Course/ Medical Decision Making/ A&P                           Medical Decision Making Pt complains of nasal drainage and congestion that causes her to vomit.    Amount and/or Complexity of Data Reviewed Labs: ordered. Decision-making details  documented in ED Course.    Details: Labs ordered reviewed and interpreted.  Covid is negative  Radiology: ordered and independent interpretation performed. Decision-making details documented in ED Course.    Details: Chest xray no acute abnormality   Discussion of management or test interpretation with external provider(s): Gi consulted.  I spoke to Dr. CMarnee Guarnerion call.  Pt will have endoscopy  tomorrow am.   Risk Risk Details: I had pt attempt to drink fluids.  Pt vomits after drinking.  Ct scan of soft tissue neck obtained.  Ct scan shows esophageal distention, I discussed with pt.  Pt recall feeling like mucus may have started after eating chicken.  Pt admits to not chewing well because her false teeth don't fit.  Pt given IV glucagon.  Pt attempted water again and vomited up chicken piece.  Pt re attempted drinking afterwards and again vomited.  Pt  apologetic that she did not think about chicken being stuck in her throat.             Final Clinical Impression(s) / ED Diagnoses Final diagnoses:  Food impaction of esophagus, initial encounter    Rx / DC Orders ED Discharge Orders     None         Sidney Ace 10/19/21 0001    Fransico Meadow, MD 10/21/21 1505

## 2021-10-18 NOTE — ED Triage Notes (Signed)
Pt presents with nasal drainage thick mucous causing her to vomit and hard to swallow.

## 2021-10-19 ENCOUNTER — Encounter (HOSPITAL_COMMUNITY): Payer: Self-pay | Admitting: *Deleted

## 2021-10-19 ENCOUNTER — Telehealth: Payer: Self-pay | Admitting: Internal Medicine

## 2021-10-19 ENCOUNTER — Encounter (HOSPITAL_COMMUNITY)
Admission: EM | Disposition: A | Discharge status: Home / Self Care | Payer: Self-pay | Source: Home / Self Care | Attending: Emergency Medicine

## 2021-10-19 ENCOUNTER — Emergency Department (HOSPITAL_COMMUNITY): Payer: Medicare HMO | Admitting: Certified Registered Nurse Anesthetist

## 2021-10-19 ENCOUNTER — Emergency Department (EMERGENCY_DEPARTMENT_HOSPITAL): Payer: Medicare HMO | Admitting: Certified Registered Nurse Anesthetist

## 2021-10-19 DIAGNOSIS — I10 Essential (primary) hypertension: Secondary | ICD-10-CM

## 2021-10-19 DIAGNOSIS — T18128A Food in esophagus causing other injury, initial encounter: Secondary | ICD-10-CM

## 2021-10-19 DIAGNOSIS — K222 Esophageal obstruction: Secondary | ICD-10-CM

## 2021-10-19 DIAGNOSIS — F418 Other specified anxiety disorders: Secondary | ICD-10-CM

## 2021-10-19 DIAGNOSIS — R69 Illness, unspecified: Secondary | ICD-10-CM | POA: Diagnosis not present

## 2021-10-19 DIAGNOSIS — I7300 Raynaud's syndrome without gangrene: Secondary | ICD-10-CM | POA: Diagnosis not present

## 2021-10-19 DIAGNOSIS — W44F3XA Food entering into or through a natural orifice, initial encounter: Secondary | ICD-10-CM

## 2021-10-19 HISTORY — PX: BALLOON DILATION: SHX5330

## 2021-10-19 HISTORY — PX: IMPACTION REMOVAL: SHX5858

## 2021-10-19 HISTORY — PX: ESOPHAGOGASTRODUODENOSCOPY (EGD) WITH PROPOFOL: SHX5813

## 2021-10-19 SURGERY — ESOPHAGOGASTRODUODENOSCOPY (EGD) WITH PROPOFOL
Anesthesia: General

## 2021-10-19 MED ORDER — PANTOPRAZOLE SODIUM 40 MG PO TBEC: 40.0000 mg | DELAYED_RELEASE_TABLET | Freq: Two times a day (BID) | ORAL | 3 refills | Status: DC

## 2021-10-19 MED ORDER — DEXAMETHASONE SODIUM PHOSPHATE 10 MG/ML IJ SOLN
INTRAMUSCULAR | Status: DC | PRN
  Administered 2021-10-19: 8 mg via INTRAVENOUS

## 2021-10-19 MED ORDER — LACTATED RINGERS IV SOLN: INTRAVENOUS | Status: DC

## 2021-10-19 MED ORDER — ESMOLOL HCL 100 MG/10ML IV SOLN
INTRAVENOUS | Status: DC | PRN
  Administered 2021-10-19: 30 mg via INTRAVENOUS

## 2021-10-19 MED ORDER — ONDANSETRON HCL 4 MG/2ML IJ SOLN
INTRAMUSCULAR | Status: DC | PRN
  Administered 2021-10-19: 4 mg via INTRAVENOUS

## 2021-10-19 MED ORDER — SODIUM CHLORIDE 0.9 % IV SOLN: Freq: Once | INTRAVENOUS | Status: AC

## 2021-10-19 MED ORDER — PROPOFOL 10 MG/ML IV BOLUS
INTRAVENOUS | Status: DC | PRN
  Administered 2021-10-19: 100 mg via INTRAVENOUS

## 2021-10-19 MED ORDER — SUCCINYLCHOLINE CHLORIDE 200 MG/10ML IV SOSY
PREFILLED_SYRINGE | INTRAVENOUS | Status: DC | PRN
  Administered 2021-10-19: 100 mg via INTRAVENOUS

## 2021-10-19 MED ORDER — LIDOCAINE HCL (CARDIAC) PF 100 MG/5ML IV SOSY
PREFILLED_SYRINGE | INTRAVENOUS | Status: DC | PRN
  Administered 2021-10-19: 50 mg via INTRATRACHEAL

## 2021-10-19 NOTE — Interval H&P Note (Signed)
History and Physical Interval Note:  10/19/2021 10:17 AM  Jenna Booth  has presented today for surgery, with the diagnosis of food bolus.  The various methods of treatment have been discussed with the patient and family. After consideration of risks, benefits and other options for treatment, the patient has consented to  Procedure(s): ESOPHAGOGASTRODUODENOSCOPY (EGD) WITH PROPOFOL (N/A) as a surgical intervention.  The patient's history has been reviewed, patient examined, no change in status, stable for surgery.  I have reviewed the patient's chart and labs.  Questions were answered to the patient's satisfaction.     [name]

## 2021-10-19 NOTE — Op Note (Signed)
[facility] Patient Name: Jenna Booth Procedure Date: 10/19/2021 10:07 AM MRN: [id number] Date of Birth: Dec 16, 1945 Attending MD: Elon Alas. Abbey Chatters DO CSN: [id number] Age: 76 Admit Type: Emergency Department Procedure:                Upper GI endoscopy Indications:              Foreign body in the esophagus Providers:                Elon Alas. Abbey Chatters, DO, Anderson Page, Wynonia Musty Tech, Technician, [place] Risa Grill, Technician Referring MD:              Medicines:                See the Anesthesia note for documentation of the                            administered medications Complications:            No immediate complications. Estimated Blood Loss:     Estimated blood loss was minimal. Procedure:                Pre-Anesthesia Assessment:                           - The anesthesia plan was to use general anesthesia.                           After obtaining informed consent, the endoscope was                            passed under direct vision. Throughout the                            procedure, the patient's blood pressure, pulse, and                            oxygen saturations were monitored continuously. The                            [id number]-H190 ([id number]) scope was introduced through the                            mouth, and advanced to the second part of duodenum.                            The upper GI endoscopy was accomplished without                            difficulty. The patient tolerated the procedure  well. Scope In: 10:25:17 AM Scope Out: 10:39:32 AM Total Procedure Duration: 0 hours 14 minutes 15 seconds  Findings:      Food was found in the middle third of the esophagus and in the lower       third of the esophagus. Removal of food was accomplished with Raptor       tooth grasper. Remaining food was then gently pushed into the stomach.      One  benign-appearing, intrinsic severe stenosis was found in the distal       esophagus. The stenosis was traversed. A TTS dilator was passed through       the scope. Dilation with an 09-12-08 mm balloon dilator was performed to       10 mm. The dilation site was examined and showed minimal improvement in       luminal narrowing. Dilation with a 11-15-10 mm balloon dilator was then       performed to 12 mm. The dilation site was examined and showed mild       mucosal disruption and moderate improvement in luminal narrowing.      The entire examined stomach was normal.      The duodenal bulb, first portion of the duodenum and second portion of       the duodenum were normal. Impression:               - Food in the middle third of the esophagus and in                            the lower third of the esophagus. Removal was                            successful.                           - Benign-appearing esophageal stenosis. Dilated.                           - Normal stomach.                           - Normal duodenal bulb, first portion of the                            duodenum and second portion of the duodenum. Moderate Sedation:      Per Anesthesia Care Recommendation:           - Patient has a contact number available for                            emergencies. The signs and symptoms of potential                            delayed complications were discussed with the                            patient. Return to normal activities tomorrow.  Written discharge instructions were provided to the                            patient.                           - Mechanical soft diet.                           - Use Protonix (pantoprazole) 40 mg PO BID.                           - Repeat upper endoscopy in 4 weeks for                            retreatment/further dilation Procedure Code(s):        --- Professional ---                           [phone],  Esophagogastroduodenoscopy, flexible,                            transoral; with removal of foreign body(s)                           43249, Esophagogastroduodenoscopy, flexible,                            transoral; with transendoscopic balloon dilation of                            esophagus (less than 30 mm diameter) Diagnosis Code(s):        --- Professional ---                           X41.287O, Food in esophagus causing other injury,                            initial encounter                           K22.2, Esophageal obstruction                           T18.108A, Unspecified foreign body in esophagus                            causing other injury, initial encounter CPT copyright 2019 [employer]. All rights reserved. The codes documented in this report are preliminary and upon coder review may  be revised to meet current compliance requirements. Elon Alas. Abbey Chatters, DO [place] Abbey Chatters, DO 10/19/2021 10:45:07 AM This report has been signed electronically. Number of Addenda: 0

## 2021-10-19 NOTE — Telephone Encounter (Signed)
Patient needs repeat EGD with dilation in 4 to 6 weeks.  Diagnosis dysphagia, esophageal stricture.  ASA 3 Thank you

## 2021-10-19 NOTE — Anesthesia Procedure Notes (Signed)
Procedure Name: Intubation Date/Time: 10/19/2021 10:22 AM  Performed by: Karna Dupes, CRNAPre-anesthesia Checklist: Patient identified, Emergency Drugs available, Suction available and Patient being monitored Patient Re-evaluated:Patient Re-evaluated prior to induction Oxygen Delivery Method: Circle system utilized Preoxygenation: Pre-oxygenation with 100% oxygen Induction Type: IV induction, Rapid sequence and Cricoid Pressure applied Laryngoscope Size: Glidescope and 4 Grade View: Grade I Tube type: Oral Tube size: 7.0 mm Number of attempts: 1 Airway Equipment and Method: Stylet Placement Confirmation: ETT inserted through vocal cords under direct vision, positive ETCO2 and breath sounds checked- equal and bilateral Secured at: 21 cm Tube secured with: Tape Dental Injury: Teeth and Oropharynx as per pre-operative assessment

## 2021-10-19 NOTE — Consult Note (Signed)
Consulting  Provider: Johnn Hai Primary Care Physician:  Coral Spikes, DO Primary Gastroenterologist: Previously unassigned, Dr. Abbey Chatters  Reason for Consultation:  Esophageal food impaction  HPI:  Jenna Booth is a 76 y.o. female with a past medical history of chronic GERD, anxiety, depression, dyslipidemia, hypertension, presented to [facility] ER yesterday with chief plaint of vomiting mucus.  States she was eating chicken 2 nights ago felt this get stuck in her substernal region.  Tried to drink liquids which induced vomiting.  Does note vomiting up piece of chicken at 1 point.  No epigastric or chest pain.  No coffee-ground emesis or hematemesis.   In the ER underwent CT scan soft tissue neck which showed esophageal distention fluid in the esophagus.  Given IV glucagon without improvement in her symptoms.  States this happened 1 time approximately 3 months ago as well and eventually passed.  No previous EGD.  Past Medical History:  Diagnosis Date   Acid reflux    Anxiety    Cognitive impairment    Depression    Dizzy spells    GERD (gastroesophageal reflux disease)    Hyperlipidemia    Hypertension    Osteoarthritis    Osteoporosis    Raynaud's disease without gangrene     Past Surgical History:  Procedure Laterality Date   CATARACT EXTRACTION W/PHACO Left 10/12/2012   Procedure: CATARACT EXTRACTION PHACO AND INTRAOCULAR LENS PLACEMENT ([place]);  Surgeon: Tonny Branch, MD;  Location: [facility];  Service: Ophthalmology;  Laterality: Left;  CDE:16.97   CRANIOTOMY Left 06/09/2012   Procedure: CRANIOTOMY HEMATOMA EVACUATION SUBDURAL;  Surgeon: Floyce Stakes, MD;  Location: [facility];  Service: Neurosurgery;  Laterality: Left;   RHINOPLASTY      Prior to Admission medications   Medication Sig Start Date End Date Taking? Authorizing Provider  acetaminophen (TYLENOL) 500 MG tablet Take 500 mg by mouth 2 (two) times daily.    [provider]  Cholecalciferol (VITAMIN  D3 PO) Take 5,000 Units by mouth daily.    [provider]  escitalopram (LEXAPRO) 20 MG tablet Take 1 tablet (20 mg total) by mouth daily. 08/28/21   Coral Spikes, DO  glucosamine-chondroitin 500-400 MG tablet Take 1 tablet by mouth 2 (two) times daily.    [provider]  loratadine (CLARITIN) 10 MG tablet Take 10 mg by mouth daily.    [provider]  Magnesium 250 MG TABS Take 1 tablet by mouth daily.    [provider]  OVER THE COUNTER MEDICATION Take 450 mg by mouth 2 (two) times daily. Ashwagandha    [provider]  Probiotic Product (PROBIOTIC DAILY PO) Take 1 tablet by mouth daily.    [provider]    No current facility-administered medications for this encounter.    Allergies as of 10/18/2021   (No Known Allergies)    Family History  Problem Relation Age of Onset   Ulcerative colitis Mother    Thyroid disease Mother        goiter   Lung cancer Father    Cardiomyopathy Sister        takotsubu   Hyperlipidemia Sister    Hypertension Sister    Rheum arthritis Sister    Thyroid disease Maternal Grandfather        died during operation for a goiter   Other Paternal Grandmother        flu epidemic   Other Paternal Grandfather  flu epidemic    Social History   Socioeconomic History   Marital status: Widowed    Spouse name: Not on file   Number of children: 0   Years of education: Not on file   Highest education level: Some college, no degree  Occupational History    Comment: retired  Tobacco Use   Smoking status: Never   Smokeless tobacco: Never  Vaping Use   Vaping Use: Never used  Substance and Sexual Activity   Alcohol use: Never   Drug use: Never   Sexual activity: Not on file  Other Topics Concern   Not on file  Social History Narrative   01/18/19 lives alone   Caffeine- coffee 1 c daily   Social Determinants of Health   Financial Resource Strain: Not on file  Food Insecurity: Not  on file  Transportation Needs: Not on file  Physical Activity: Not on file  Stress: Not on file  Social Connections: Not on file  Intimate Partner Violence: Not on file    Review of Systems: General: Negative for anorexia, weight loss, fever, chills, fatigue, weakness. Eyes: Negative for vision changes.  ENT: Negative for hoarseness, difficulty swallowing , nasal congestion. CV: Negative for chest pain, angina, palpitations, dyspnea on exertion, peripheral edema.  Respiratory: Negative for dyspnea at rest, dyspnea on exertion, cough, sputum, wheezing.  GI: See history of present illness. GU:  Negative for dysuria, hematuria, urinary incontinence, urinary frequency, nocturnal urination.  MS: Negative for joint pain, low back pain.  Derm: Negative for rash or itching.  Neuro: Negative for weakness, abnormal sensation, seizure, frequent headaches, memory loss, confusion.  Psych: Negative for anxiety, depression Endo: Negative for unusual weight change.  Heme: Negative for bruising or bleeding. Allergy: Negative for rash or hives.  Physical Exam: Vital signs in last 24 hours: Temp:  [97.8 F (36.6 C)-98.2 F (36.8 C)] 98.1 F (36.7 C) (09/15 0926) Pulse Rate:  [61-69] 64 (09/15 0913) Resp:  [12-20] 12 (09/15 0926) BP: (112-139)/(59-81) 139/81 (09/15 0926) SpO2:  [100 %] 100 % (09/15 0926) Weight:  [67.2 kg] 67.2 kg (09/14 1430)   General:   Alert,  Well-developed, well-nourished, pleasant and cooperative in NAD Head:  Normocephalic and atraumatic. Eyes:  Sclera clear, no icterus.   Conjunctiva pink. Ears:  Normal auditory acuity. Nose:  No deformity, discharge,  or lesions. Mouth:  No deformity or lesions, dentition normal. Neck:  Supple; no masses or thyromegaly. Lungs:  Clear throughout to auscultation.   No wheezes, crackles, or rhonchi. No acute distress. Heart:  Regular rate and rhythm; no murmurs, clicks, rubs,  or gallops. Abdomen:  Soft, nontender and nondistended. No  masses, hepatosplenomegaly or hernias noted. Normal bowel sounds, without guarding, and without rebound.   Msk:  Symmetrical without gross deformities. Normal posture. Pulses:  Normal pulses noted. Extremities:  Without clubbing or edema. Neurologic:  Alert and  oriented x4;  grossly normal neurologically. Skin:  Intact without significant lesions or rashes. Cervical Nodes:  No significant cervical adenopathy. Psych:  Alert and cooperative. Normal mood and affect.  Intake/Output from previous day: No intake/output data recorded. Intake/Output this shift: No intake/output data recorded.  Lab Results: Recent Labs    10/18/21 1732  WBC 7.1  HGB 13.6  HCT 41.3  PLT 177   BMET Recent Labs    10/18/21 1732  NA 138  K 4.3  CL 106  CO2 21*  GLUCOSE 96  BUN 28*  CREATININE 0.65  CALCIUM 9.9   LFT  Recent Labs    10/18/21 1732  PROT 7.9  ALBUMIN 4.9  AST 19  ALT 16  ALKPHOS 45  BILITOT 1.0   PT/INR No results for input(s): "LABPROT", "INR" in the last 72 hours. Hepatitis Panel No results for input(s): "HEPBSAG", "HCVAB", "HEPAIGM", "HEPBIGM" in the last 72 hours. C-Diff No results for input(s): "CDIFFTOX" in the last 72 hours.  Studies/Results: CT Soft Tissue Neck W Contrast  Result Date: 10/18/2021 CLINICAL DATA:  Palate weakness (CN 9). Nasal drainage with difficulty swallowing and vomiting. EXAM: CT NECK WITH CONTRAST TECHNIQUE: Multidetector CT imaging of the neck was performed using the standard protocol following the bolus administration of intravenous contrast. RADIATION DOSE REDUCTION: This exam was performed according to the departmental dose-optimization program which includes automated exposure control, adjustment of the mA and/or kV according to patient size and/or use of iterative reconstruction technique. CONTRAST:  78m OMNIPAQUE IOHEXOL 300 MG/ML  SOLN COMPARISON:  CT head and maxillofacial 01/21/2019 FINDINGS: Pharynx and larynx: No evidence of mass or  swelling. Widely patent airway. No fluid collection or inflammatory changes in the parapharyngeal or retropharyngeal spaces. Salivary glands: No inflammation, mass, or stone. Thyroid: Unremarkable. Lymph nodes: No enlarged or suspicious lymph nodes in the neck. Vascular: Major vascular structures of the neck appear patent. Partially retropharyngeal course of the internal carotid arteries. Limited intracranial: Unremarkable. Visualized orbits: Only the inferior most aspects of the orbits were imaged and are unremarkable. Mastoids and visualized paranasal sinuses: Partially imaged opacification of a posterior left ethmoid air cell. Clear mastoid air cells. Skeleton: Mild to moderate disc and advanced facet degeneration in the cervical spine. Facet and partial interbody ankylosis at C3-4. Upper chest: Clear lung apices. Distension of the included proximal esophagus by fluid and gas. Other: None. IMPRESSION: 1. No acute abnormality or mass in the neck. 2. Distended esophagus containing fluid which could reflect dysmotility or reflux. Electronically Signed   By: ALogan BoresM.D.   On: 10/18/2021 19:31   DG Chest Portable 1 View  Result Date: 10/18/2021 CLINICAL DATA:  Chest discomfort. EXAM: PORTABLE CHEST 1 VIEW COMPARISON:  January 24, 2017. FINDINGS: The heart size and mediastinal contours are within normal limits. Both lungs are clear. The visualized skeletal structures are unremarkable. IMPRESSION: No active disease. Electronically Signed   By: JMarijo ConceptionM.D.   On: 10/18/2021 14:58    Impression: Esophageal food impaction  Plan: We will proceed with urgent endoscopy to relieve esophageal food impaction. The risks including infection, bleed, or perforation as well as benefits, limitations, alternatives and imponderables have been reviewed with the patient. Potential for esophageal dilation, biopsy, etc. have also been reviewed.  Questions have been answered. All parties agreeable.  Keep patient  NPO.   CElon Alas CAbbey Chatters D.O. Gastroenterology and Hepatology [facility]    LOS: 0 days     10/19/2021, 9:38 AM

## 2021-10-19 NOTE — H&P (View-Only) (Signed)
Consulting  Provider: Johnn Hai Primary Care Physician:  Coral Spikes, DO Primary Gastroenterologist: Previously unassigned, Dr. Abbey Chatters  Reason for Consultation:  Esophageal food impaction  HPI:  Jenna Booth is a 76 y.o. female with a past medical history of chronic GERD, anxiety, depression, dyslipidemia, hypertension, presented to [facility] ER yesterday with chief plaint of vomiting mucus.  States she was eating chicken 2 nights ago felt this get stuck in her substernal region.  Tried to drink liquids which induced vomiting.  Does note vomiting up piece of chicken at 1 point.  No epigastric or chest pain.  No coffee-ground emesis or hematemesis.   In the ER underwent CT scan soft tissue neck which showed esophageal distention fluid in the esophagus.  Given IV glucagon without improvement in her symptoms.  States this happened 1 time approximately 3 months ago as well and eventually passed.  No previous EGD.  Past Medical History:  Diagnosis Date   Acid reflux    Anxiety    Cognitive impairment    Depression    Dizzy spells    GERD (gastroesophageal reflux disease)    Hyperlipidemia    Hypertension    Osteoarthritis    Osteoporosis    Raynaud's disease without gangrene     Past Surgical History:  Procedure Laterality Date   CATARACT EXTRACTION W/PHACO Left 10/12/2012   Procedure: CATARACT EXTRACTION PHACO AND INTRAOCULAR LENS PLACEMENT ([place]);  Surgeon: Tonny Branch, MD;  Location: [facility];  Service: Ophthalmology;  Laterality: Left;  CDE:16.97   CRANIOTOMY Left 06/09/2012   Procedure: CRANIOTOMY HEMATOMA EVACUATION SUBDURAL;  Surgeon: Floyce Stakes, MD;  Location: [facility];  Service: Neurosurgery;  Laterality: Left;   RHINOPLASTY      Prior to Admission medications   Medication Sig Start Date End Date Taking? Authorizing Provider  acetaminophen (TYLENOL) 500 MG tablet Take 500 mg by mouth 2 (two) times daily.    [provider]  Cholecalciferol (VITAMIN  D3 PO) Take 5,000 Units by mouth daily.    [provider]  escitalopram (LEXAPRO) 20 MG tablet Take 1 tablet (20 mg total) by mouth daily. 08/28/21   Coral Spikes, DO  glucosamine-chondroitin 500-400 MG tablet Take 1 tablet by mouth 2 (two) times daily.    [provider]  loratadine (CLARITIN) 10 MG tablet Take 10 mg by mouth daily.    [provider]  Magnesium 250 MG TABS Take 1 tablet by mouth daily.    [provider]  OVER THE COUNTER MEDICATION Take 450 mg by mouth 2 (two) times daily. Ashwagandha    [provider]  Probiotic Product (PROBIOTIC DAILY PO) Take 1 tablet by mouth daily.    [provider]    No current facility-administered medications for this encounter.    Allergies as of 10/18/2021   (No Known Allergies)    Family History  Problem Relation Age of Onset   Ulcerative colitis Mother    Thyroid disease Mother        goiter   Lung cancer Father    Cardiomyopathy Sister        takotsubu   Hyperlipidemia Sister    Hypertension Sister    Rheum arthritis Sister    Thyroid disease Maternal Grandfather        died during operation for a goiter   Other Paternal Grandmother        flu epidemic   Other Paternal Grandfather  flu epidemic    Social History   Socioeconomic History   Marital status: Widowed    Spouse name: Not on file   Number of children: 0   Years of education: Not on file   Highest education level: Some college, no degree  Occupational History    Comment: retired  Tobacco Use   Smoking status: Never   Smokeless tobacco: Never  Vaping Use   Vaping Use: Never used  Substance and Sexual Activity   Alcohol use: Never   Drug use: Never   Sexual activity: Not on file  Other Topics Concern   Not on file  Social History Narrative   01/18/19 lives alone   Caffeine- coffee 1 c daily   Social Determinants of Health   Financial Resource Strain: Not on file  Food Insecurity: Not  on file  Transportation Needs: Not on file  Physical Activity: Not on file  Stress: Not on file  Social Connections: Not on file  Intimate Partner Violence: Not on file    Review of Systems: General: Negative for anorexia, weight loss, fever, chills, fatigue, weakness. Eyes: Negative for vision changes.  ENT: Negative for hoarseness, difficulty swallowing , nasal congestion. CV: Negative for chest pain, angina, palpitations, dyspnea on exertion, peripheral edema.  Respiratory: Negative for dyspnea at rest, dyspnea on exertion, cough, sputum, wheezing.  GI: See history of present illness. GU:  Negative for dysuria, hematuria, urinary incontinence, urinary frequency, nocturnal urination.  MS: Negative for joint pain, low back pain.  Derm: Negative for rash or itching.  Neuro: Negative for weakness, abnormal sensation, seizure, frequent headaches, memory loss, confusion.  Psych: Negative for anxiety, depression Endo: Negative for unusual weight change.  Heme: Negative for bruising or bleeding. Allergy: Negative for rash or hives.  Physical Exam: Vital signs in last 24 hours: Temp:  [97.8 F (36.6 C)-98.2 F (36.8 C)] 98.1 F (36.7 C) (09/15 0926) Pulse Rate:  [61-69] 64 (09/15 0913) Resp:  [12-20] 12 (09/15 0926) BP: (112-139)/(59-81) 139/81 (09/15 0926) SpO2:  [100 %] 100 % (09/15 0926) Weight:  [67.2 kg] 67.2 kg (09/14 1430)   General:   Alert,  Well-developed, well-nourished, pleasant and cooperative in NAD Head:  Normocephalic and atraumatic. Eyes:  Sclera clear, no icterus.   Conjunctiva pink. Ears:  Normal auditory acuity. Nose:  No deformity, discharge,  or lesions. Mouth:  No deformity or lesions, dentition normal. Neck:  Supple; no masses or thyromegaly. Lungs:  Clear throughout to auscultation.   No wheezes, crackles, or rhonchi. No acute distress. Heart:  Regular rate and rhythm; no murmurs, clicks, rubs,  or gallops. Abdomen:  Soft, nontender and nondistended. No  masses, hepatosplenomegaly or hernias noted. Normal bowel sounds, without guarding, and without rebound.   Msk:  Symmetrical without gross deformities. Normal posture. Pulses:  Normal pulses noted. Extremities:  Without clubbing or edema. Neurologic:  Alert and  oriented x4;  grossly normal neurologically. Skin:  Intact without significant lesions or rashes. Cervical Nodes:  No significant cervical adenopathy. Psych:  Alert and cooperative. Normal mood and affect.  Intake/Output from previous day: No intake/output data recorded. Intake/Output this shift: No intake/output data recorded.  Lab Results: Recent Labs    10/18/21 1732  WBC 7.1  HGB 13.6  HCT 41.3  PLT 177   BMET Recent Labs    10/18/21 1732  NA 138  K 4.3  CL 106  CO2 21*  GLUCOSE 96  BUN 28*  CREATININE 0.65  CALCIUM 9.9   LFT  Recent Labs    10/18/21 1732  PROT 7.9  ALBUMIN 4.9  AST 19  ALT 16  ALKPHOS 45  BILITOT 1.0   PT/INR No results for input(s): "LABPROT", "INR" in the last 72 hours. Hepatitis Panel No results for input(s): "HEPBSAG", "HCVAB", "HEPAIGM", "HEPBIGM" in the last 72 hours. C-Diff No results for input(s): "CDIFFTOX" in the last 72 hours.  Studies/Results: CT Soft Tissue Neck W Contrast  Result Date: 10/18/2021 CLINICAL DATA:  Palate weakness (CN 9). Nasal drainage with difficulty swallowing and vomiting. EXAM: CT NECK WITH CONTRAST TECHNIQUE: Multidetector CT imaging of the neck was performed using the standard protocol following the bolus administration of intravenous contrast. RADIATION DOSE REDUCTION: This exam was performed according to the departmental dose-optimization program which includes automated exposure control, adjustment of the mA and/or kV according to patient size and/or use of iterative reconstruction technique. CONTRAST:  61m OMNIPAQUE IOHEXOL 300 MG/ML  SOLN COMPARISON:  CT head and maxillofacial 01/21/2019 FINDINGS: Pharynx and larynx: No evidence of mass or  swelling. Widely patent airway. No fluid collection or inflammatory changes in the parapharyngeal or retropharyngeal spaces. Salivary glands: No inflammation, mass, or stone. Thyroid: Unremarkable. Lymph nodes: No enlarged or suspicious lymph nodes in the neck. Vascular: Major vascular structures of the neck appear patent. Partially retropharyngeal course of the internal carotid arteries. Limited intracranial: Unremarkable. Visualized orbits: Only the inferior most aspects of the orbits were imaged and are unremarkable. Mastoids and visualized paranasal sinuses: Partially imaged opacification of a posterior left ethmoid air cell. Clear mastoid air cells. Skeleton: Mild to moderate disc and advanced facet degeneration in the cervical spine. Facet and partial interbody ankylosis at C3-4. Upper chest: Clear lung apices. Distension of the included proximal esophagus by fluid and gas. Other: None. IMPRESSION: 1. No acute abnormality or mass in the neck. 2. Distended esophagus containing fluid which could reflect dysmotility or reflux. Electronically Signed   By: ALogan BoresM.D.   On: 10/18/2021 19:31   DG Chest Portable 1 View  Result Date: 10/18/2021 CLINICAL DATA:  Chest discomfort. EXAM: PORTABLE CHEST 1 VIEW COMPARISON:  January 24, 2017. FINDINGS: The heart size and mediastinal contours are within normal limits. Both lungs are clear. The visualized skeletal structures are unremarkable. IMPRESSION: No active disease. Electronically Signed   By: JMarijo ConceptionM.D.   On: 10/18/2021 14:58    Impression: Esophageal food impaction  Plan: We will proceed with urgent endoscopy to relieve esophageal food impaction. The risks including infection, bleed, or perforation as well as benefits, limitations, alternatives and imponderables have been reviewed with the patient. Potential for esophageal dilation, biopsy, etc. have also been reviewed.  Questions have been answered. All parties agreeable.  Keep patient  NPO.   CElon Alas CAbbey Chatters D.O. Gastroenterology and Hepatology [facility]    LOS: 0 days     10/19/2021, 9:38 AM

## 2021-10-19 NOTE — Anesthesia Postprocedure Evaluation (Signed)
Anesthesia Post Note  Patient: Jenna Booth  Procedure(s) Performed: ESOPHAGOGASTRODUODENOSCOPY (EGD) WITH PROPOFOL IMPACTION REMOVAL BALLOON DILATION  Patient location during evaluation: PACU Anesthesia Type: General Level of consciousness: awake and alert and oriented Pain management: pain level controlled Vital Signs Assessment: post-procedure vital signs reviewed and stable Respiratory status: spontaneous breathing, nonlabored ventilation and respiratory function stable Cardiovascular status: blood pressure returned to baseline and stable Postop Assessment: no apparent nausea or vomiting Anesthetic complications: no   No notable events documented.   Last Vitals:  Vitals:   10/19/21 1058 10/19/21 1100  BP: 116/63 116/70  Pulse: 82 81  Resp: 16 17  Temp:  36.7 C  SpO2: 100% 100%    Last Pain:  Vitals:   10/19/21 1058  TempSrc:   PainSc: 0-No pain                 Rendy Lazard C Ethelene Closser

## 2021-10-19 NOTE — Transfer of Care (Signed)
Immediate Anesthesia Transfer of Care Note  Patient: Jenna Booth  Procedure(s) Performed: ESOPHAGOGASTRODUODENOSCOPY (EGD) WITH PROPOFOL IMPACTION REMOVAL BALLOON DILATION  Patient Location: PACU  Anesthesia Type:General  Level of Consciousness: awake and alert   Airway & Oxygen Therapy: Patient Spontanous Breathing  Post-op Assessment: Report given to RN and Post -op Vital signs reviewed and stable  Post vital signs: Reviewed and stable  Last Vitals:  Vitals Value Taken Time  BP 139/70   Temp 98   Pulse 126 10/19/21 1047  Resp 20 10/19/21 1047  SpO2 98 % 10/19/21 1047  Vitals shown include unvalidated device data.  Last Pain:  Vitals:   10/19/21 1018  TempSrc:   PainSc: 0-No pain      Patients Stated Pain Goal: 8 (16/10/96 0454)  Complications: No notable events documented.

## 2021-10-19 NOTE — Discharge Instructions (Signed)
EGD Discharge instructions Please read the instructions outlined below and refer to this sheet in the next few weeks. These discharge instructions provide you with general information on caring for yourself after you leave the hospital. Your doctor may also give you specific instructions. While your treatment has been planned according to the most current medical practices available, unavoidable complications occasionally occur. If you have any problems or questions after discharge, please call your doctor. ACTIVITY You may resume your regular activity but move at a slower pace for the next 24 hours.  Take frequent rest periods for the next 24 hours.  Walking will help expel (get rid of) the air and reduce the bloated feeling in your abdomen.  No driving for 24 hours (because of the anesthesia (medicine) used during the test).  You may shower.  Do not sign any important legal documents or operate any machinery for 24 hours (because of the anesthesia used during the test).  NUTRITION Drink plenty of fluids.  You may resume your normal diet.  Begin with a light meal and progress to your normal diet.  Avoid alcoholic beverages for 24 hours or as instructed by your caregiver.  MEDICATIONS You may resume your normal medications unless your caregiver tells you otherwise.  WHAT YOU CAN EXPECT TODAY You may experience abdominal discomfort such as a feeling of fullness or "gas" pains.  FOLLOW-UP Your doctor will discuss the results of your test with you.  SEEK IMMEDIATE MEDICAL ATTENTION IF ANY OF THE FOLLOWING OCCUR: Excessive nausea (feeling sick to your stomach) and/or vomiting.  Severe abdominal pain and distention (swelling).  Trouble swallowing.  Temperature over 101 F (37.8 C).  Rectal bleeding or vomiting of blood.   Your upper endoscopy revealed a large amount of food in your esophagus.  I removed this successfully.  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  I stretched this with a balloon today.  Your stomach and small bowel appeared normal.  I am going to start you on a new medication called pantoprazole 40 mg twice daily.  I have sent this to your mail in pharmacy.  Recommend repeat upper endoscopy in 4 weeks with further dilation.  In the meantime, please keep your diet soft, cut any meats into very small pieces.   I hope you have a great rest of your week!  Elon Alas. Abbey Chatters, D.O. Gastroenterology and Hepatology [facility]

## 2021-10-19 NOTE — Anesthesia Preprocedure Evaluation (Signed)
Anesthesia Evaluation  Patient identified by MRN, date of birth, ID band Patient awake    Reviewed: Allergy & Precautions, NPO status , Patient's Chart, lab work & pertinent test results  Airway Mallampati: II  TM Distance: >3 FB Neck ROM: Full    Dental  (+) Edentulous Upper, Edentulous Lower   Pulmonary shortness of breath and with exertion, pneumonia, resolved,    Pulmonary exam normal breath sounds clear to auscultation       Cardiovascular Exercise Tolerance: Poor hypertension, Pt. on medications + Peripheral Vascular Disease (Raynaud's disease without gangrene)  Normal cardiovascular exam Rhythm:Regular Rate:Normal     Neuro/Psych PSYCHIATRIC DISORDERS Anxiety Depression CVA (craniotomy)    GI/Hepatic Neg liver ROS, GERD  ,  Endo/Other  negative endocrine ROS  Renal/GU negative Renal ROS  negative genitourinary   Musculoskeletal  (+) Arthritis , Osteoarthritis,    Abdominal   Peds negative pediatric ROS (+)  Hematology negative hematology ROS (+)   Anesthesia Other Findings Memory difficulties Craniotomy   Reproductive/Obstetrics negative OB ROS                            Anesthesia Physical Anesthesia Plan  ASA: 3 and emergent  Anesthesia Plan: General   Post-op Pain Management: Minimal or no pain anticipated   Induction: Intravenous, Rapid sequence and Cricoid pressure planned  PONV Risk Score and Plan: Ondansetron and Dexamethasone  Airway Management Planned: Oral ETT  Additional Equipment:   Intra-op Plan:   Post-operative Plan: Extubation in OR and Possible Post-op intubation/ventilation  Informed Consent: I have reviewed the patients History and Physical, chart, labs and discussed the procedure including the risks, benefits and alternatives for the proposed anesthesia with the patient or authorized representative who has indicated his/her understanding and  acceptance.       Plan Discussed with: CRNA and Surgeon  Anesthesia Plan Comments:         Anesthesia Quick Evaluation

## 2021-10-22 NOTE — Telephone Encounter (Signed)
Spoke with pt. She has been scheduled for 10/16 at 11:!5am. Aware will mail instructions/pre-op appt. She voiced understanding.

## 2021-10-25 ENCOUNTER — Encounter (HOSPITAL_COMMUNITY): Payer: Self-pay | Admitting: Internal Medicine

## 2021-11-14 ENCOUNTER — Encounter (HOSPITAL_COMMUNITY)
Admission: RE | Admit: 2021-11-14 | Discharge: 2021-11-14 | Disposition: A | Discharge status: Home / Self Care | Payer: Medicare HMO | Source: Ambulatory Visit | Attending: Internal Medicine | Admitting: Internal Medicine

## 2021-11-14 ENCOUNTER — Other Ambulatory Visit: Payer: Self-pay

## 2021-11-14 ENCOUNTER — Encounter (HOSPITAL_COMMUNITY): Payer: Self-pay

## 2021-11-19 ENCOUNTER — Ambulatory Visit (HOSPITAL_COMMUNITY): Payer: Medicare HMO | Admitting: Certified Registered"

## 2021-11-19 ENCOUNTER — Ambulatory Visit (HOSPITAL_COMMUNITY)
Admission: RE | Admit: 2021-11-19 | Discharge: 2021-11-19 | Disposition: A | Discharge status: Home / Self Care | Payer: Medicare HMO | Attending: Internal Medicine | Admitting: Internal Medicine

## 2021-11-19 ENCOUNTER — Encounter (HOSPITAL_COMMUNITY): Payer: Self-pay

## 2021-11-19 ENCOUNTER — Encounter (HOSPITAL_COMMUNITY)
Admission: RE | Disposition: A | Discharge status: Home / Self Care | Payer: Self-pay | Source: Home / Self Care | Attending: Internal Medicine

## 2021-11-19 ENCOUNTER — Ambulatory Visit (HOSPITAL_BASED_OUTPATIENT_CLINIC_OR_DEPARTMENT_OTHER): Payer: Medicare HMO | Admitting: Certified Registered"

## 2021-11-19 DIAGNOSIS — R69 Illness, unspecified: Secondary | ICD-10-CM | POA: Diagnosis not present

## 2021-11-19 DIAGNOSIS — K222 Esophageal obstruction: Secondary | ICD-10-CM

## 2021-11-19 DIAGNOSIS — K219 Gastro-esophageal reflux disease without esophagitis: Secondary | ICD-10-CM | POA: Insufficient documentation

## 2021-11-19 DIAGNOSIS — K449 Diaphragmatic hernia without obstruction or gangrene: Secondary | ICD-10-CM

## 2021-11-19 DIAGNOSIS — I10 Essential (primary) hypertension: Secondary | ICD-10-CM

## 2021-11-19 DIAGNOSIS — F418 Other specified anxiety disorders: Secondary | ICD-10-CM | POA: Diagnosis not present

## 2021-11-19 DIAGNOSIS — R1310 Dysphagia, unspecified: Secondary | ICD-10-CM | POA: Diagnosis not present

## 2021-11-19 HISTORY — PX: ESOPHAGOGASTRODUODENOSCOPY (EGD) WITH PROPOFOL: SHX5813

## 2021-11-19 HISTORY — PX: BALLOON DILATION: SHX5330

## 2021-11-19 SURGERY — ESOPHAGOGASTRODUODENOSCOPY (EGD) WITH PROPOFOL
Anesthesia: General

## 2021-11-19 MED ORDER — LIDOCAINE HCL (CARDIAC) PF 100 MG/5ML IV SOSY
PREFILLED_SYRINGE | INTRAVENOUS | Status: DC | PRN
  Administered 2021-11-19: 50 mg via INTRATRACHEAL

## 2021-11-19 MED ORDER — LACTATED RINGERS IV SOLN: INTRAVENOUS | Status: DC | PRN

## 2021-11-19 MED ORDER — PROPOFOL 10 MG/ML IV BOLUS
INTRAVENOUS | Status: DC | PRN
  Administered 2021-11-19: 20 mg via INTRAVENOUS
  Administered 2021-11-19: 60 mg via INTRAVENOUS
  Administered 2021-11-19: 20 mg via INTRAVENOUS

## 2021-11-19 NOTE — H&P (Signed)
Primary Care Physician:  Coral Spikes, DO Primary Gastroenterologist:  Dr. Abbey Chatters  Pre-Procedure History & Physical: HPI:  Jenna Booth is a 76 y.o. female is here for an EGD with dilation to be performed for dysphagia, esophageal stricture, history of food bolus.  Past Medical History:  Diagnosis Date   Acid reflux    Anxiety    Cognitive impairment    Depression    Dizzy spells    GERD (gastroesophageal reflux disease)    Hyperlipidemia    Hypertension    Osteoarthritis    Osteoporosis    Raynaud's disease without gangrene     Past Surgical History:  Procedure Laterality Date   BALLOON DILATION  10/19/2021   Procedure: BALLOON DILATION;  Surgeon: [name], DO;  Location: [facility];  Service: Endoscopy;;   CATARACT EXTRACTION W/PHACO Left 10/12/2012   Procedure: CATARACT EXTRACTION PHACO AND INTRAOCULAR LENS PLACEMENT (IOC);  Surgeon: Tonny Branch, MD;  Location: [facility];  Service: Ophthalmology;  Laterality: Left;  CDE:16.97   CRANIOTOMY Left 06/09/2012   Procedure: CRANIOTOMY HEMATOMA EVACUATION SUBDURAL;  Surgeon: Floyce Stakes, MD;  Location: [facility];  Service: Neurosurgery;  Laterality: Left;   ESOPHAGOGASTRODUODENOSCOPY (EGD) WITH PROPOFOL N/A 10/19/2021   Procedure: ESOPHAGOGASTRODUODENOSCOPY (EGD) WITH PROPOFOL;  Surgeon: [name], DO;  Location: [facility];  Service: Endoscopy;  Laterality: N/A;   IMPACTION REMOVAL  10/19/2021   Procedure: IMPACTION REMOVAL;  Surgeon: [name], DO;  Location: [facility];  Service: Endoscopy;;   RHINOPLASTY      Prior to Admission medications   Medication Sig Start Date End Date Taking? Authorizing Provider  acetaminophen (TYLENOL) 650 MG CR tablet Take 650 mg by mouth every 8 (eight) hours as needed for pain.   Yes [provider]  Camphor-Menthol-Methyl Sal (SALONPAS) 3.02-10-08 % PTCH Place 1 patch onto the skin daily as needed (pain).   Yes [provider]  Cholecalciferol  (VITAMIN D3) 250 MCG (10000 UT) TABS Take 10,000 Units by mouth daily.   Yes [provider]  Coenzyme Q10 (CO Q-10) 100 MG CAPS Take 100 mg by mouth daily.   Yes [provider]  escitalopram (LEXAPRO) 20 MG tablet Take 1 tablet (20 mg total) by mouth daily. 08/28/21  Yes Cook, Jayce G, DO  loratadine (CLARITIN) 10 MG tablet Take 10 mg by mouth daily.   Yes [provider]  Magnesium 500 MG TABS Take 500 mg by mouth daily.   Yes [provider]  Menatetrenone (VITAMIN K2) 100 MCG TABS Take 100 mg by mouth daily.   Yes [provider]  Misc Natural Products (GLUCOSAMINE CHOND MSM FORMULA) TABS Take 1 tablet by mouth in the morning, at noon, and at bedtime.   Yes [provider]  pantoprazole (PROTONIX) 40 MG tablet Take 1 tablet (40 mg total) by mouth 2 (two) times daily before a meal. 10/19/21 10/19/22 Yes Lether Tesch, Elon Alas, DO  Polyethyl Glycol-Propyl Glycol (SYSTANE OP) Place 1 drop into both eyes at bedtime.   Yes [provider]  Probiotic Product (PROBIOTIC DAILY PO) Take 1 tablet by mouth daily.   Yes [provider]  sodium chloride (OCEAN) 0.65 % SOLN nasal spray Place 1 spray into both nostrils daily.   Yes [provider]  vitamin B-12 (CYANOCOBALAMIN) 500 MCG tablet Take 500 mcg by mouth daily.   Yes [provider]    Allergies as of 10/22/2021   (No Known Allergies)  Family History  Problem Relation Age of Onset   Ulcerative colitis Mother    Thyroid disease Mother        goiter   Lung cancer Father    Cardiomyopathy Sister        takotsubu   Hyperlipidemia Sister    Hypertension Sister    Rheum arthritis Sister    Thyroid disease Maternal Grandfather        died during operation for a goiter   Other Paternal Grandmother        flu epidemic   Other Paternal Grandfather        flu epidemic    Social History   Socioeconomic History   Marital status: Widowed    Spouse name: Not  on file   Number of children: 0   Years of education: Not on file   Highest education level: Some college, no degree  Occupational History    Comment: retired  Tobacco Use   Smoking status: Never   Smokeless tobacco: Never  Vaping Use   Vaping Use: Never used  Substance and Sexual Activity   Alcohol use: Never   Drug use: Never   Sexual activity: Not on file  Other Topics Concern   Not on file  Social History Narrative   01/18/19 lives alone   Caffeine- coffee 1 c daily   Social Determinants of Health   Financial Resource Strain: Not on file  Food Insecurity: Not on file  Transportation Needs: Not on file  Physical Activity: Not on file  Stress: Not on file  Social Connections: Not on file  Intimate Partner Violence: Not on file    Review of Systems: General: Negative for fever, chills, fatigue, weakness. Eyes: Negative for vision changes.  ENT: Negative for hoarseness, difficulty swallowing , nasal congestion. CV: Negative for chest pain, angina, palpitations, dyspnea on exertion, peripheral edema.  Respiratory: Negative for dyspnea at rest, dyspnea on exertion, cough, sputum, wheezing.  GI: See history of present illness. GU:  Negative for dysuria, hematuria, urinary incontinence, urinary frequency, nocturnal urination.  MS: Negative for joint pain, low back pain.  Derm: Negative for rash or itching.  Neuro: Negative for weakness, abnormal sensation, seizure, frequent headaches, memory loss, confusion.  Psych: Negative for anxiety, depression Endo: Negative for unusual weight change.  Heme: Negative for bruising or bleeding. Allergy: Negative for rash or hives.  Physical Exam: Vital signs in last 24 hours: Temp:  [98 F (36.7 C)] 98 F (36.7 C) (10/16 0936) Pulse Rate:  [58] 58 (10/16 0936) Resp:  [13] 13 (10/16 0936) BP: (135)/(52) 135/52 (10/16 0936) SpO2:  [100 %] 100 % (10/16 0936) Weight:  [67.2 kg] 67.2 kg (10/16 0936)   General:   Alert,   Well-developed, well-nourished, pleasant and cooperative in NAD Head:  Normocephalic and atraumatic. Eyes:  Sclera clear, no icterus.   Conjunctiva pink. Ears:  Normal auditory acuity. Nose:  No deformity, discharge,  or lesions. Mouth:  No deformity or lesions, dentition normal. Neck:  Supple; no masses or thyromegaly. Lungs:  Clear throughout to auscultation.   No wheezes, crackles, or rhonchi. No acute distress. Heart:  Regular rate and rhythm; no murmurs, clicks, rubs,  or gallops. Abdomen:  Soft, nontender and nondistended. No masses, hepatosplenomegaly or hernias noted. Normal bowel sounds, without guarding, and without rebound.   Msk:  Symmetrical without gross deformities. Normal posture. Extremities:  Without clubbing or edema. Neurologic:  Alert and  oriented x4;  grossly normal neurologically. Skin:  Intact without  significant lesions or rashes. Cervical Nodes:  No significant cervical adenopathy. Psych:  Alert and cooperative. Normal mood and affect.   Impression/Plan: Jenna Booth is here for an EGD with dilation to be performed for dysphagia, esophageal stricture, history of food bolus.  Risks, benefits, limitations, imponderables and alternatives regarding EGD have been reviewed with the patient. Questions have been answered. All parties agreeable.

## 2021-11-19 NOTE — Anesthesia Postprocedure Evaluation (Signed)
Anesthesia Post Note  Patient: Jenna Booth  Procedure(s) Performed: ESOPHAGOGASTRODUODENOSCOPY (EGD) WITH PROPOFOL BALLOON DILATION  Patient location during evaluation: Phase II Anesthesia Type: General Level of consciousness: awake Pain management: pain level controlled Vital Signs Assessment: post-procedure vital signs reviewed and stable Respiratory status: spontaneous breathing and respiratory function stable Cardiovascular status: blood pressure returned to baseline and stable Postop Assessment: no headache and no apparent nausea or vomiting Anesthetic complications: no Comments: Late entry   No notable events documented.   Last Vitals:  Vitals:   11/19/21 0936 11/19/21 1047  BP: (!) 135/52 (!) 95/48  Pulse: (!) 58 (!) 53  Resp: 13 16  Temp: 36.7 C 36.9 C  SpO2: 100% 94%    Last Pain:  Vitals:   11/19/21 1047  TempSrc: Oral  PainSc: 0-No pain                 Louann Sjogren

## 2021-11-19 NOTE — Anesthesia Preprocedure Evaluation (Signed)
Anesthesia Evaluation  Patient identified by MRN, date of birth, ID band Patient awake    Reviewed: Allergy & Precautions, H&P , NPO status , Patient's Chart, lab work & pertinent test results, reviewed documented beta blocker date and time   Airway Mallampati: II  TM Distance: >3 FB Neck ROM: full    Dental no notable dental hx.    Pulmonary neg pulmonary ROS,    Pulmonary exam normal breath sounds clear to auscultation       Cardiovascular Exercise Tolerance: Good hypertension, negative cardio ROS   Rhythm:regular Rate:Normal     Neuro/Psych PSYCHIATRIC DISORDERS Anxiety Depression negative neurological ROS     GI/Hepatic Neg liver ROS, GERD  Medicated,  Endo/Other  negative endocrine ROS  Renal/GU negative Renal ROS  negative genitourinary   Musculoskeletal   Abdominal   Peds  Hematology negative hematology ROS (+)   Anesthesia Other Findings   Reproductive/Obstetrics negative OB ROS                             Anesthesia Physical Anesthesia Plan  ASA: 3  Anesthesia Plan: General   Post-op Pain Management:    Induction:   PONV Risk Score and Plan: Propofol infusion  Airway Management Planned:   Additional Equipment:   Intra-op Plan:   Post-operative Plan:   Informed Consent: I have reviewed the patients History and Physical, chart, labs and discussed the procedure including the risks, benefits and alternatives for the proposed anesthesia with the patient or authorized representative who has indicated his/her understanding and acceptance.     Dental Advisory Given  Plan Discussed with: CRNA  Anesthesia Plan Comments:         Anesthesia Quick Evaluation

## 2021-11-19 NOTE — Discharge Instructions (Signed)
EGD Discharge instructions Please read the instructions outlined below and refer to this sheet in the next few weeks. These discharge instructions provide you with general information on caring for yourself after you leave the hospital. Your doctor may also give you specific instructions. While your treatment has been planned according to the most current medical practices available, unavoidable complications occasionally occur. If you have any problems or questions after discharge, please call your doctor. ACTIVITY You may resume your regular activity but move at a slower pace for the next 24 hours.  Take frequent rest periods for the next 24 hours.  Walking will help expel (get rid of) the air and reduce the bloated feeling in your abdomen.  No driving for 24 hours (because of the anesthesia (medicine) used during the test).  You may shower.  Do not sign any important legal documents or operate any machinery for 24 hours (because of the anesthesia used during the test).  NUTRITION Drink plenty of fluids.  You may resume your normal diet.  Begin with a light meal and progress to your normal diet.  Avoid alcoholic beverages for 24 hours or as instructed by your caregiver.  MEDICATIONS You may resume your normal medications unless your caregiver tells you otherwise.  WHAT YOU CAN EXPECT TODAY You may experience abdominal discomfort such as a feeling of fullness or "gas" pains.  FOLLOW-UP Your doctor will discuss the results of your test with you.  SEEK IMMEDIATE MEDICAL ATTENTION IF ANY OF THE FOLLOWING OCCUR: Excessive nausea (feeling sick to your stomach) and/or vomiting.  Severe abdominal pain and distention (swelling).  Trouble swallowing.  Temperature over 101 F (37.8 C).  Rectal bleeding or vomiting of blood.   I again noted the stricture in your lower esophagus.  I did stretch it out to 15 mm today.  I would keep your diet soft for 2-3 more days.  After that, you can start to  introduce meats and other foods.  Be sure to chop/cut any meats into small pieces prior to swallowing.  Drink plenty of water with meals.  Continue on pantoprazole twice daily.  Your stomach and small bowel appeared normal.  Follow-up with Dr. Abbey Chatters in 3 months.   I hope you have a great rest of your week!  Elon Alas. Abbey Chatters, D.O. Gastroenterology and Hepatology [facility]

## 2021-11-19 NOTE — Op Note (Signed)
[facility] Patient Name: Jenna Booth Procedure Date: 11/19/2021 10:23 AM MRN: [id number] Date of Birth: 1946-01-20 Attending MD: Elon Alas. Abbey Chatters DO CSN: [id number] Age: 76 Admit Type: Outpatient Procedure:                Upper GI endoscopy Indications:              Dysphagia, Follow-up of esophageal stenosis Providers:                Elon Alas. Abbey Chatters, DO, Lambert Mody, Lorin Picket, Technician Referring MD:              Medicines:                See the Anesthesia note for documentation of the                            administered medications Complications:            No immediate complications. Estimated Blood Loss:     Estimated blood loss was minimal. Procedure:                Pre-Anesthesia Assessment:                           - The anesthesia plan was to use monitored                            anesthesia care (MAC).                           After obtaining informed consent, the endoscope was                            passed under direct vision. Throughout the                            procedure, the patient's blood pressure, pulse, and                            oxygen saturations were monitored continuously. The                            GIF-H190 ([id number]) scope was introduced through the                            mouth, and advanced to the second part of duodenum.                            The upper GI endoscopy was accomplished without                            difficulty. The patient tolerated the procedure                            well. Scope  In: 10:37:41 AM Scope Out: 10:42:27 AM Total Procedure Duration: 0 hours 4 minutes 46 seconds  Findings:      A 4 cm hiatal hernia was present.      One benign-appearing, intrinsic moderate stenosis was found in the       distal esophagus. This stenosis measured 1 cm (in length). The stenosis       was traversed. A TTS dilator was passed through the scope.  Dilation with       a 12-13.5-15 mm balloon dilator was performed to 15 mm. The dilation       site was examined and showed moderate mucosal disruption and moderate       improvement in luminal narrowing.      The entire examined stomach was normal.      The duodenal bulb, first portion of the duodenum and second portion of       the duodenum were normal. Impression:               - 4 cm hiatal hernia.                           - Benign-appearing esophageal stenosis. Dilated.                           - Normal stomach.                           - Normal duodenal bulb, first portion of the                            duodenum and second portion of the duodenum.                           - No specimens collected. Moderate Sedation:      Per Anesthesia Care Recommendation:           - Patient has a contact number available for                            emergencies. The signs and symptoms of potential                            delayed complications were discussed with the                            patient. Return to normal activities tomorrow.                            Written discharge instructions were provided to the                            patient.                           - Soft diet for 2 days then dysphagia diet after.                            Cut meat into small pieces. Drink  plenty of water                            with meals.                           - Use a proton pump inhibitor PO BID.                           - No ibuprofen, naproxen, or other non-steroidal                            anti-inflammatory drugs.                           - Return to GI clinic in 3 months. Procedure Code(s):        --- Professional ---                           [phone], Esophagogastroduodenoscopy, flexible,                            transoral; with transendoscopic balloon dilation of                            esophagus (less than 30 mm diameter) Diagnosis Code(s):        ---  Professional ---                           K44.9, Diaphragmatic hernia without obstruction or                            gangrene                           K22.2, Esophageal obstruction                           R13.10, Dysphagia, unspecified CPT copyright 2019 [employer]. All rights reserved. The codes documented in this report are preliminary and upon coder review may  be revised to meet current compliance requirements. Elon Alas. Abbey Chatters, DO [place] Abbey Chatters, DO 11/19/2021 10:46:46 AM This report has been signed electronically. Number of Addenda: 0

## 2021-11-19 NOTE — Transfer of Care (Signed)
Immediate Anesthesia Transfer of Care Note  Patient: Jenna Booth  Procedure(s) Performed: ESOPHAGOGASTRODUODENOSCOPY (EGD) WITH PROPOFOL BALLOON DILATION  Patient Location: PACU  Anesthesia Type:General  Level of Consciousness: sedated and patient cooperative  Airway & Oxygen Therapy: Patient Spontanous Breathing and Patient connected to nasal cannula oxygen  Post-op Assessment: Report given to RN, Post -op Vital signs reviewed and stable and Patient moving all extremities  Post vital signs: Reviewed and stable  Last Vitals:  Vitals Value Taken Time  BP 95/48 11/19/21 1047  Temp 36.9 C 11/19/21 1047  Pulse 53 11/19/21 1047  Resp 16 11/19/21 1047  SpO2 94 % 11/19/21 1047    Last Pain:  Vitals:   11/19/21 1047  TempSrc: Oral  PainSc: 0-No pain         Complications: No notable events documented.

## 2021-11-23 ENCOUNTER — Encounter (HOSPITAL_COMMUNITY): Payer: Self-pay | Admitting: Internal Medicine

## 2021-11-23 NOTE — Addendum Note (Signed)
Addendum  created 11/23/21 0930 by Myna Bright, CRNA   Intraprocedure Staff edited

## 2021-11-28 ENCOUNTER — Ambulatory Visit: Payer: Medicare HMO | Admitting: Family Medicine

## 2022-02-06 ENCOUNTER — Telehealth: Payer: Self-pay

## 2022-02-06 DIAGNOSIS — F32A Depression, unspecified: Secondary | ICD-10-CM

## 2022-02-06 DIAGNOSIS — F419 Anxiety disorder, unspecified: Secondary | ICD-10-CM

## 2022-02-06 DIAGNOSIS — E559 Vitamin D deficiency, unspecified: Secondary | ICD-10-CM

## 2022-02-06 DIAGNOSIS — E785 Hyperlipidemia, unspecified: Secondary | ICD-10-CM

## 2022-02-06 DIAGNOSIS — E7800 Pure hypercholesterolemia, unspecified: Secondary | ICD-10-CM

## 2022-02-06 DIAGNOSIS — E538 Deficiency of other specified B group vitamins: Secondary | ICD-10-CM

## 2022-02-06 NOTE — Telephone Encounter (Signed)
Spoke with patient via phone. Patient advised per Dr. Lacinda Axon order CBC, CMP, Lipid, TSH, Vitamin D, and B12 for 02/28/2021. Patient verbalized understanding.

## 2022-02-06 NOTE — Telephone Encounter (Signed)
Caller name: KRISSI WILLAIMS  On DPR?: Yes  Call back number: There is no such number on file (mobile).  Provider they see: Coral Spikes, DO  Reason for call:Order labs for 25th appt

## 2022-02-14 DIAGNOSIS — E7800 Pure hypercholesterolemia, unspecified: Secondary | ICD-10-CM | POA: Diagnosis not present

## 2022-02-14 DIAGNOSIS — F32A Depression, unspecified: Secondary | ICD-10-CM | POA: Diagnosis not present

## 2022-02-14 DIAGNOSIS — F419 Anxiety disorder, unspecified: Secondary | ICD-10-CM | POA: Diagnosis not present

## 2022-02-14 DIAGNOSIS — E785 Hyperlipidemia, unspecified: Secondary | ICD-10-CM | POA: Diagnosis not present

## 2022-02-14 DIAGNOSIS — E559 Vitamin D deficiency, unspecified: Secondary | ICD-10-CM | POA: Diagnosis not present

## 2022-02-14 DIAGNOSIS — E538 Deficiency of other specified B group vitamins: Secondary | ICD-10-CM | POA: Diagnosis not present

## 2022-02-15 LAB — LIPID PANEL
Chol/HDL Ratio: 3.7 ratio (ref 0.0–4.4)
Cholesterol, Total: 389 mg/dL — ABNORMAL HIGH (ref 100–199)
HDL: 105 mg/dL (ref >39)
LDL Chol Calc (NIH): 264 mg/dL — ABNORMAL HIGH (ref 0–99)
Triglycerides: 118 mg/dL (ref 0–149)
VLDL Cholesterol Cal: 20 mg/dL (ref 5–40)

## 2022-02-15 LAB — CBC WITH DIFFERENTIAL/PLATELET
Basophils Absolute: 0.1 10*3/uL (ref 0.0–0.2)
Basos: 1 %
EOS (ABSOLUTE): 0.2 10*3/uL (ref 0.0–0.4)
Eos: 4 %
Hematocrit: 41.1 % (ref 34.0–46.6)
Hemoglobin: 13.5 g/dL (ref 11.1–15.9)
Immature Grans (Abs): 0 10*3/uL (ref 0.0–0.1)
Immature Granulocytes: 0 %
Lymphocytes Absolute: 2.4 10*3/uL (ref 0.7–3.1)
Lymphs: 37 %
MCH: 31 pg (ref 26.6–33.0)
MCHC: 32.8 g/dL (ref 31.5–35.7)
MCV: 94 fL (ref 79–97)
Monocytes Absolute: 0.6 10*3/uL (ref 0.1–0.9)
Monocytes: 9 %
Neutrophils Absolute: 3.3 10*3/uL (ref 1.4–7.0)
Neutrophils: 49 %
Platelets: 245 10*3/uL (ref 150–450)
RBC: 4.36 x10E6/uL (ref 3.77–5.28)
RDW: 12.4 % (ref 11.7–15.4)
WBC: 6.6 10*3/uL (ref 3.4–10.8)

## 2022-02-15 LAB — COMPREHENSIVE METABOLIC PANEL
ALT: 10 IU/L (ref 0–32)
AST: 18 IU/L (ref 0–40)
Albumin/Globulin Ratio: 2 (ref 1.2–2.2)
Albumin: 5 g/dL — ABNORMAL HIGH (ref 3.8–4.8)
Alkaline Phosphatase: 105 IU/L (ref 44–121)
BUN/Creatinine Ratio: 26 (ref 12–28)
BUN: 24 mg/dL (ref 8–27)
Bilirubin Total: 0.5 mg/dL (ref 0.0–1.2)
CO2: 22 mmol/L (ref 20–29)
Calcium: 10 mg/dL (ref 8.7–10.3)
Chloride: 96 mmol/L (ref 96–106)
Creatinine, Ser: 0.93 mg/dL (ref 0.57–1.00)
Globulin, Total: 2.5 g/dL (ref 1.5–4.5)
Glucose: 113 mg/dL — ABNORMAL HIGH (ref 70–99)
Potassium: 4.1 mmol/L (ref 3.5–5.2)
Sodium: 136 mmol/L (ref 134–144)
Total Protein: 7.5 g/dL (ref 6.0–8.5)
eGFR: 63 mL/min/{1.73_m2} (ref >59)

## 2022-02-15 LAB — TSH: TSH: 4.03 u[IU]/mL (ref 0.450–4.500)

## 2022-02-15 LAB — VITAMIN D 25 HYDROXY (VIT D DEFICIENCY, FRACTURES): Vit D, 25-Hydroxy: 129 ng/mL — ABNORMAL HIGH (ref 30.0–100.0)

## 2022-02-15 LAB — VITAMIN B12: Vitamin B-12: 1354 pg/mL — ABNORMAL HIGH (ref 232–1245)

## 2022-02-28 ENCOUNTER — Ambulatory Visit (INDEPENDENT_AMBULATORY_CARE_PROVIDER_SITE_OTHER): Payer: PPO | Admitting: Family Medicine

## 2022-02-28 ENCOUNTER — Encounter: Payer: Self-pay | Admitting: Family Medicine

## 2022-02-28 DIAGNOSIS — R413 Other amnesia: Secondary | ICD-10-CM

## 2022-02-28 DIAGNOSIS — E7800 Pure hypercholesterolemia, unspecified: Secondary | ICD-10-CM | POA: Diagnosis not present

## 2022-02-28 DIAGNOSIS — M810 Age-related osteoporosis without current pathological fracture: Secondary | ICD-10-CM | POA: Diagnosis not present

## 2022-02-28 DIAGNOSIS — Z23 Encounter for immunization: Secondary | ICD-10-CM

## 2022-02-28 MED ORDER — REPATHA 140 MG/ML ~~LOC~~ SOSY: 140.0000 mg | PREFILLED_SYRINGE | SUBCUTANEOUS | 11 refills | Status: DC

## 2022-02-28 NOTE — Patient Instructions (Signed)
We will see if we can get repatha approved.  Continue your medications.  Follow up in 6 months.

## 2022-02-28 NOTE — Assessment & Plan Note (Signed)
Declines referral to neurology.

## 2022-02-28 NOTE — Assessment & Plan Note (Signed)
Declines treatment.

## 2022-02-28 NOTE — Assessment & Plan Note (Signed)
Uncontrolled.  Trial of PCSK9.

## 2022-02-28 NOTE — Progress Notes (Signed)
Subjective:  Patient ID: Jenna Booth, female    DOB: 1945-02-16  Age: 77 y.o. MRN: [id number]  CC: Chief Complaint  Patient presents with   Follow-up    Pt exhausted all the time due to Long Covid-affected memory, dizziness, brain fog(worsening)     HPI:  77  year old female presents for follow-up.  Patient continues to report issues with chronic fatigue and brain fog and dizziness.  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  Has had MRI imaging which was essentially unremarkable.  Offered neurology referral.  Patient declines.  Patient's osteoporosis is untreated.  Patient declines medication.  Hyperlipidemia is uncontrolled.  Patient has familial hyperlipidemia based off of laboratory studies with severely elevated LDL of 264.  Will discuss medication options today.  Patient is overdue for pneumococcal vaccine.  She would like to get one.  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 08/29/2021   Overweight (BMI 25.0-29.9) 08/29/2021   Dizziness 08/29/2021   Memory difficulties 08/29/2021   Osteoporosis 08/29/2021   Anxiety and depression 08/29/2021   Hyperlipidemia 08/28/2021    Social Hx   Social History   Socioeconomic History   Marital status: Widowed    Spouse name: Not on file   Number of children: 0   Years of education: Not on file   Highest education level: Some college, no degree  Occupational History    Comment: retired  Tobacco Use   Smoking status: Never   Smokeless tobacco: Never  Vaping Use   Vaping Use: Never used  Substance and Sexual Activity   Alcohol use: Never   Drug use: Never   Sexual activity: Not on file  Other Topics Concern   Not on file  Social History Narrative   01/18/19 lives alone   Caffeine- coffee 1 c daily   Social Determinants of Health   Financial Resource Strain: Not on file  Food Insecurity: Not on file   Transportation Needs: Not on file  Physical Activity: Not on file  Stress: Not on file  Social Connections: Not on file    Review of Systems Per HPI  Objective:  BP 137/87   Pulse (!) 111   Temp (!) 97.5 F (36.4 C)   Wt 157 lb 6.4 oz (71.4 kg)   SpO2 93%   BMI 31.79 kg/m      02/28/2022    1:42 PM 11/19/2021   10:47 AM 11/19/2021    9:36 AM  BP/Weight  Systolic BP 948 95 546  Diastolic BP 87 48 52  Wt. (Lbs) 157.4  148.15  BMI 31.79 kg/m2  29.92 kg/m2    Physical Exam Vitals and nursing note reviewed.  Constitutional:      Appearance: Normal appearance. She is obese.  HENT:     Head: Normocephalic and atraumatic.  Eyes:     General:        Right eye: No discharge.        Left eye: No discharge.     Conjunctiva/sclera: Conjunctivae normal.  Cardiovascular:     Rate and Rhythm: Normal rate and regular rhythm.  Pulmonary:     Effort: Pulmonary effort is normal.     Breath sounds: Normal breath sounds. No wheezing, rhonchi or rales.  Neurological:     Mental Status: She is alert.  Psychiatric:        Mood and Affect: Mood normal.  Behavior: Behavior normal.     Lab Results  Component Value Date   WBC 6.6 02/14/2022   HGB 13.5 02/14/2022   HCT 41.1 02/14/2022   PLT 245 02/14/2022   GLUCOSE 113 (H) 02/14/2022   CHOL 389 (H) 02/14/2022   TRIG 118 02/14/2022   HDL 105 02/14/2022   LDLCALC 264 (H) 02/14/2022   ALT 10 02/14/2022   AST 18 02/14/2022   NA 136 02/14/2022   K 4.1 02/14/2022   CL 96 02/14/2022   CREATININE 0.93 02/14/2022   BUN 24 02/14/2022   CO2 22 02/14/2022   TSH 4.030 02/14/2022   INR 1.00 06/09/2012     Assessment & Plan:   Problem List Items Addressed This Visit       Musculoskeletal and Integument   Osteoporosis    Declines treatment.        Other   Hyperlipidemia    Uncontrolled.  Trial of PCSK9.      Relevant Medications   Evolocumab (REPATHA) 140 MG/ML SOSY   Memory difficulties    Declines referral  to neurology.      Other Visit Diagnoses     Need for vaccination       Relevant Orders   Pneumococcal conjugate vaccine 20-valent (Prevnar 20) (Completed)       Meds ordered this encounter  Medications   Evolocumab (REPATHA) 140 MG/ML SOSY    Sig: Inject 140 mg into the skin every 14 (fourteen) days.    Dispense:  2 mL    Refill:  11    Follow-up:  Return in about 6 months (around 08/29/2022).  [place]

## 2022-03-07 ENCOUNTER — Encounter: Payer: Self-pay | Admitting: Internal Medicine

## 2022-04-03 ENCOUNTER — Encounter: Payer: Self-pay | Admitting: Internal Medicine

## 2022-04-03 ENCOUNTER — Ambulatory Visit (INDEPENDENT_AMBULATORY_CARE_PROVIDER_SITE_OTHER): Payer: PPO | Admitting: Internal Medicine

## 2022-04-03 VITALS — BP 134/78 | HR 75 | Temp 98.0°F | Ht 59.0 in | Wt 160.8 lb

## 2022-04-03 DIAGNOSIS — K219 Gastro-esophageal reflux disease without esophagitis: Secondary | ICD-10-CM | POA: Diagnosis not present

## 2022-04-03 DIAGNOSIS — R1319 Other dysphagia: Secondary | ICD-10-CM

## 2022-04-03 DIAGNOSIS — R351 Nocturia: Secondary | ICD-10-CM | POA: Diagnosis not present

## 2022-04-03 DIAGNOSIS — K449 Diaphragmatic hernia without obstruction or gangrene: Secondary | ICD-10-CM | POA: Diagnosis not present

## 2022-04-03 DIAGNOSIS — N3941 Urge incontinence: Secondary | ICD-10-CM | POA: Diagnosis not present

## 2022-04-03 DIAGNOSIS — K222 Esophageal obstruction: Secondary | ICD-10-CM

## 2022-04-03 NOTE — Patient Instructions (Signed)
I think we can continue to watch your difficulty swallowing for now.  If this happens again, then let us know and we will schedule you for upper endoscopy with repeat esophageal dilation.  Continue on pantoprazole twice daily.  Recommend you avoid tough textures. All meats should be chopped finely. Eat slowly, take small bites, chew thoroughly, and drink plenty of liquids throughout meals. If something were to get hung in your esophagus and not come up or go down, Then you should proceed to the emergency room.  I will refer you to urology to discuss your urinary issues.   Follow up in 3-4 months.   It was very nice seeing you again today.   Dr. Abbey Chatters

## 2022-04-03 NOTE — Progress Notes (Signed)
Referring Provider: Coral Spikes, DO Primary Care Physician:  Coral Spikes, DO Primary GI:  Dr. Abbey Chatters  Chief Complaint  Patient presents with   Dysphagia    Patient reports last Saturday she choked on a piece of cheese toast. Took about an hour to get it down.     HPI:   Jenna Booth is a 77 y.o. female who presents to the clinic today for follow-up visit.  I initially saw her in the hospital 10/19/2021 when she presented with esophageal food bolus.  Food bolus was successfully removed.  A very tight distal esophageal stricture noted dilated up to 12 mm.  Started on pantoprazole twice daily.  Repeat upper endoscopy 11/19/2021 with dilation up to 15 mm.  Since that time patient states she has been doing very well.  Had 1 episode of dysphagia a few days ago where food got stuck and eventually passed.  Otherwise doing well.  States she does drink a lot of liquids with meals though this is difficult as it causes issues with urinary incontinence and nocturia.   No melena or hematochezia.   Past Medical History:  Diagnosis Date   Acid reflux    Anxiety    Cognitive impairment    Depression    Dizzy spells    GERD (gastroesophageal reflux disease)    Hyperlipidemia    Hypertension    Osteoarthritis    Osteoporosis    Raynaud's disease without gangrene     Past Surgical History:  Procedure Laterality Date   BALLOON DILATION  10/19/2021   Procedure: BALLOON DILATION;  Surgeon: [name], DO;  Location: [facility];  Service: Endoscopy;;   BALLOON DILATION N/A 11/19/2021   Procedure: [place];  Surgeon: [name], DO;  Location: [facility];  Service: Endoscopy;  Laterality: N/A;   CATARACT EXTRACTION W/PHACO Left 10/12/2012   Procedure: CATARACT EXTRACTION PHACO AND INTRAOCULAR LENS PLACEMENT (IOC);  Surgeon: Tonny Branch, MD;  Location: [facility];  Service: Ophthalmology;  Laterality: Left;  CDE:16.97   CRANIOTOMY Left 06/09/2012   Procedure:  CRANIOTOMY HEMATOMA EVACUATION SUBDURAL;  Surgeon: Floyce Stakes, MD;  Location: [facility];  Service: Neurosurgery;  Laterality: Left;   ESOPHAGOGASTRODUODENOSCOPY (EGD) WITH PROPOFOL N/A 10/19/2021   Procedure: ESOPHAGOGASTRODUODENOSCOPY (EGD) WITH PROPOFOL;  Surgeon: [name], DO;  Location: [facility];  Service: Endoscopy;  Laterality: N/A;   ESOPHAGOGASTRODUODENOSCOPY (EGD) WITH PROPOFOL N/A 11/19/2021   Procedure: ESOPHAGOGASTRODUODENOSCOPY (EGD) WITH PROPOFOL;  Surgeon: [name], DO;  Location: [facility];  Service: Endoscopy;  Laterality: N/A;  11:15AM, ASA 3   IMPACTION REMOVAL  10/19/2021   Procedure: IMPACTION REMOVAL;  Surgeon: [name], DO;  Location: [facility];  Service: Endoscopy;;   RHINOPLASTY      Current Outpatient Medications  Medication Sig Dispense Refill   acetaminophen (TYLENOL) 650 MG CR tablet Take 650 mg by mouth every 8 (eight) hours as needed for pain.     Camphor-Menthol-Methyl Sal (SALONPAS) 3.02-10-08 % PTCH Place 1 patch onto the skin daily as needed (pain).     Coenzyme Q10 (CO Q-10) 100 MG CAPS Take 100 mg by mouth daily.     escitalopram (LEXAPRO) 20 MG tablet Take 1 tablet (20 mg total) by mouth daily. 90 tablet 3   loratadine (CLARITIN) 10 MG tablet Take 10 mg by mouth daily.     Magnesium 500 MG TABS Take 500 mg by mouth daily.  Menatetrenone (VITAMIN K2) 100 MCG TABS Take 100 mg by mouth daily.     Misc Natural Products (GLUCOSAMINE CHOND MSM FORMULA) TABS Take 1 tablet by mouth in the morning, at noon, and at bedtime.     naproxen sodium (ALEVE) 220 MG tablet Take 220 mg by mouth. 2 At night     pantoprazole (PROTONIX) 40 MG tablet Take 1 tablet (40 mg total) by mouth 2 (two) times daily before a meal. 180 tablet 3   Polyethyl Glycol-Propyl Glycol (SYSTANE OP) Place 1 drop into both eyes at bedtime.     Probiotic Product (PROBIOTIC DAILY PO) Take 1 tablet by mouth daily.     sodium chloride (OCEAN) 0.65 % SOLN  nasal spray Place 1 spray into both nostrils daily.     Evolocumab (REPATHA) 140 MG/ML SOSY Inject 140 mg into the skin every 14 (fourteen) days. (Patient not taking: Reported on 04/03/2022) 2 mL 11   No current facility-administered medications for this visit.    Allergies as of 04/03/2022   (No Known Allergies)    Family History  Problem Relation Age of Onset   Ulcerative colitis Mother    Thyroid disease Mother        goiter   Lung cancer Father    Cardiomyopathy Sister        takotsubu   Hyperlipidemia Sister    Hypertension Sister    Rheum arthritis Sister    Thyroid disease Maternal Grandfather        died during operation for a goiter   Other Paternal Grandmother        flu epidemic   Other Paternal Grandfather        flu epidemic    Social History   Socioeconomic History   Marital status: Widowed    Spouse name: Not on file   Number of children: 0   Years of education: Not on file   Highest education level: Some college, no degree  Occupational History    Comment: retired  Tobacco Use   Smoking status: Never    Passive exposure: Never   Smokeless tobacco: Never  Vaping Use   Vaping Use: Never used  Substance and Sexual Activity   Alcohol use: Never   Drug use: Never   Sexual activity: Not on file  Other Topics Concern   Not on file  Social History Narrative   01/18/19 lives alone   Caffeine- coffee 1 c daily   Social Determinants of Health   Financial Resource Strain: Not on file  Food Insecurity: Not on file  Transportation Needs: Not on file  Physical Activity: Not on file  Stress: Not on file  Social Connections: Not on file    Subjective: Review of Systems  Constitutional:  Negative for chills and fever.  HENT:  Negative for congestion and hearing loss.   Eyes:  Negative for blurred vision and double vision.  Respiratory:  Negative for cough and shortness of breath.   Cardiovascular:  Negative for chest pain and palpitations.   Gastrointestinal:  Negative for abdominal pain, blood in stool, constipation, diarrhea, melena and vomiting.       Dysphagia  Genitourinary:  Negative for dysuria and urgency.  Musculoskeletal:  Negative for joint pain and myalgias.  Skin:  Negative for itching and rash.  Neurological:  Negative for dizziness and headaches.  Psychiatric/Behavioral:  Negative for depression. The patient is not nervous/anxious.      Objective: BP 134/78 (BP Location: Right Arm, Patient Position: Sitting,  Cuff Size: Large)   Pulse 75   Temp 98 F (36.7 C) (Oral)   Ht '4\' 11"'$  (1.499 m)   Wt 160 lb 12.8 oz (72.9 kg)   BMI 32.48 kg/m  Physical Exam Constitutional:      Appearance: Normal appearance.  HENT:     Head: Normocephalic and atraumatic.  Eyes:     Extraocular Movements: Extraocular movements intact.     Conjunctiva/sclera: Conjunctivae normal.  Cardiovascular:     Rate and Rhythm: Normal rate and regular rhythm.  Pulmonary:     Effort: Pulmonary effort is normal.     Breath sounds: Normal breath sounds.  Abdominal:     General: Bowel sounds are normal.     Palpations: Abdomen is soft.  Musculoskeletal:        General: No swelling. Normal range of motion.     Cervical back: Normal range of motion and neck supple.  Skin:    General: Skin is warm and dry.     Coloration: Skin is not jaundiced.  Neurological:     General: No focal deficit present.     Mental Status: She is alert and oriented to person, place, and time.  Psychiatric:        Mood and Affect: Mood normal.        Behavior: Behavior normal.      Assessment: *Esophageal Dysphagia *Esophageal stricture *Chronic GERD *Hiatal hernia *Nocturia *Urinary incontinence  Plan: Patient overall doing much better status post most recent esophageal dilation.  1 episode of dysphagia.  I think we can continue to monitor for now.  If this happens again, we will need to proceed with upper endoscopy with repeat esophageal  dilation.  Continue on pantoprazole twice daily.  Patient to avoid tough textures. All meats should be chopped finely. Eat slowly, take small bites, chew thoroughly, and drink plenty of liquids throughout meals. Advised if something were to get hung in her esophagus and not come up or go down, she should proceed to the emergency room.  Patient complaining of urinary incontinence as well as nocturia with as many as 9 episodes in the middle of the night having to urinate.  Will refer to urology for further evaluation.  Follow-up in 3 to 4 months.  04/03/2022 9:28 AM   Disclaimer: This note was dictated with voice recognition software. Similar sounding words can inadvertently be transcribed and may not be corrected upon review.

## 2022-05-30 ENCOUNTER — Telehealth: Payer: Self-pay | Admitting: Family Medicine

## 2022-05-30 NOTE — Telephone Encounter (Signed)
See patient call message , refill for tramadol not on current med list.

## 2022-05-30 NOTE — Telephone Encounter (Signed)
Patient was seen 02/28/22 and was told if she ran out of her tramadol 50 mg  you would refill for her for her arthritis.This medication is not on her current medication list because she had some medication left over from her old provider. [facility] please deliver

## 2022-06-02 ENCOUNTER — Other Ambulatory Visit: Payer: Self-pay | Admitting: Family Medicine

## 2022-06-03 ENCOUNTER — Other Ambulatory Visit: Payer: Self-pay | Admitting: Family Medicine

## 2022-06-03 NOTE — Telephone Encounter (Signed)
Spoke with the patient and she says tramadol 50 mg was last prescribed by Dr Margo Aye her retired pcp. She is requesting Tramadol for hip pain. Sleeps in recliner past 13 yrs due to pain. Please advise

## 2022-06-04 NOTE — Telephone Encounter (Signed)
Patient was informed per drs recommendations. She scheduled for 06/18/22

## 2022-06-18 ENCOUNTER — Encounter: Payer: Self-pay | Admitting: Family Medicine

## 2022-06-18 ENCOUNTER — Ambulatory Visit (INDEPENDENT_AMBULATORY_CARE_PROVIDER_SITE_OTHER): Payer: PPO | Admitting: Family Medicine

## 2022-06-18 VITALS — BP 127/81 | HR 73 | Temp 97.3°F | Ht 59.0 in | Wt 160.0 lb

## 2022-06-18 DIAGNOSIS — E7800 Pure hypercholesterolemia, unspecified: Secondary | ICD-10-CM

## 2022-06-18 DIAGNOSIS — N3281 Overactive bladder: Secondary | ICD-10-CM | POA: Diagnosis not present

## 2022-06-18 DIAGNOSIS — G8929 Other chronic pain: Secondary | ICD-10-CM | POA: Diagnosis not present

## 2022-06-18 MED ORDER — TRAMADOL HCL 50 MG PO TABS: 50.0000 mg | ORAL_TABLET | Freq: Two times a day (BID) | ORAL | 0 refills | Status: DC | PRN

## 2022-06-18 NOTE — Progress Notes (Signed)
Subjective:  Patient ID: Jenna Booth, female    DOB: [birth year]  Age: 77 y.o. MRN: [id number]  CC: Chief Complaint  Patient presents with   Arthritis    Knees, hips, back pain request for Tramadol - sleeps in recliner   Dizziness    Near falls and very unsteady on feet , recent covid 4 weeks ago Reports memory fog, and fatigue, dizzy and unsteady    HPI:  77 year old female with the below mentioned medical problems presents for follow-up.  Patient reports that she has significant arthritis.  She is most bothered by back pain.  She states that she was previously on tramadol.  She would like to restart this.  She has significant pain and difficulty sleeping as result.  Patient has severe hyperlipidemia.  Suspected familial hyperlipidemia given marked total cholesterol elevation as well as LDL elevation.  I previously prescribed Repatha but patient never picked it up.  Unsure what happened.  Will discuss this today.  Patient reports that she is suffering from overactive bladder.  She states that she gets up approximately 3 times a night.  Will discuss treatment today.  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 08/29/2021   Dizziness 08/29/2021   Memory difficulties 08/29/2021   Osteoporosis 08/29/2021   Anxiety and depression 08/29/2021   Hyperlipidemia 08/28/2021    Social Hx   Social History   Socioeconomic History   Marital status: Widowed    Spouse name: Not on file   Number of children: 0   Years of education: Not on file   Highest education level: Some college, no degree  Occupational History    Comment: retired  Tobacco Use   Smoking status: Never    Passive exposure: Never   Smokeless tobacco: Never  Vaping Use   Vaping Use: Never used  Substance and Sexual Activity   Alcohol use: Never   Drug  use: Never   Sexual activity: Not on file  Other Topics Concern   Not on file  Social History Narrative   01/18/19 lives alone   Caffeine- coffee 1 c daily   Social Determinants of Health   Financial Resource Strain: Not on file  Food Insecurity: Not on file  Transportation Needs: Not on file  Physical Activity: Not on file  Stress: Not on file  Social Connections: Not on file    Review of Systems Per HPI  Objective:  BP 127/81   Pulse 73   Temp (!) 97.3 F (36.3 C)   Ht 4\' 11"  (1.499 m)   Wt 160 lb (72.6 kg)   SpO2 100%   BMI 32.32 kg/m      06/18/2022    9:13 AM 04/03/2022    9:21 AM 04/03/2022    9:18 AM  BP/Weight  Systolic BP 127 134 144  Diastolic BP 81 78 89  Wt. (Lbs) 160  160.8  BMI 32.32 kg/m2  32.48 kg/m2    Physical Exam Vitals and nursing note reviewed.  Constitutional:      General: She is not in acute distress.    Appearance: Normal appearance.  HENT:     Head: Normocephalic and atraumatic.  Cardiovascular:     Rate and Rhythm: Normal rate and regular rhythm.  Pulmonary:     Effort: Pulmonary effort is normal.     Breath sounds: Normal  breath sounds. No wheezing, rhonchi or rales.  Neurological:     Mental Status: She is alert.  Psychiatric:        Mood and Affect: Mood normal.        Behavior: Behavior normal.     Lab Results  Component Value Date   WBC 6.6 02/14/2022   HGB 13.5 02/14/2022   HCT 41.1 02/14/2022   PLT 245 02/14/2022   GLUCOSE 113 (H) 02/14/2022   CHOL 389 (H) 02/14/2022   TRIG 118 02/14/2022   HDL 105 02/14/2022   LDLCALC 264 (H) 02/14/2022   ALT 10 02/14/2022   AST 18 02/14/2022   NA 136 02/14/2022   K 4.1 02/14/2022   CL 96 02/14/2022   CREATININE 0.93 02/14/2022   BUN 24 02/14/2022   CO2 22 02/14/2022   TSH 4.030 02/14/2022   INR 1.00 06/09/2012     Assessment & Plan:   Problem List Items Addressed This Visit       Genitourinary   Overactive bladder    We discussed pharmacologic treatment  today.  Patient states that she feels like she is doing okay and wants to wait on treatment at this time.        Other   Chronic pain - Primary    Chronic pain secondary to arthritis/degenerative changes. Tramadol as directed.      Relevant Medications   traMADol (ULTRAM) 50 MG tablet   Hyperlipidemia    We had a lengthy discussion about this today.  Patient is at high risk given the severity of her hyperlipidemia.  She is aware.  She declines treatment at this time.  I advised to consider treatment with statin and also treatment with PCSK9.  I also advised her to consider referral to cardiology so that she can see the lipid clinic.       Meds ordered this encounter  Medications   traMADol (ULTRAM) 50 MG tablet    Sig: Take 1 tablet (50 mg total) by mouth every 12 (twelve) hours as needed for severe pain or moderate pain.    Dispense:  60 tablet    Refill:  0    Follow-up:  Return in about 6 months (around 12/19/2022).  Everlene Other DO [facility]

## 2022-06-18 NOTE — Assessment & Plan Note (Signed)
We discussed pharmacologic treatment today.  Patient states that she feels like she is doing okay and wants to wait on treatment at this time.

## 2022-06-18 NOTE — Assessment & Plan Note (Signed)
We had a lengthy discussion about this today.  Patient is at high risk given the severity of her hyperlipidemia.  She is aware.  She declines treatment at this time.  I advised to consider treatment with statin and also treatment with PCSK9.  I also advised her to consider referral to cardiology so that she can see the lipid clinic.

## 2022-06-18 NOTE — Patient Instructions (Signed)
Consider treatment for OAB if this worsens.  Consider treatment for the cholesterol.  Tramadol as directed.  Follow up in 6 months.   Take care  Dr. Adriana Simas

## 2022-06-18 NOTE — Assessment & Plan Note (Signed)
Chronic pain secondary to arthritis/degenerative changes. Tramadol as directed.

## 2022-07-12 ENCOUNTER — Other Ambulatory Visit: Payer: Self-pay | Admitting: Family Medicine

## 2022-07-17 ENCOUNTER — Other Ambulatory Visit: Payer: Self-pay | Admitting: Family Medicine

## 2022-07-17 NOTE — Telephone Encounter (Signed)
Refill on  traMADol (ULTRAM) 50 MG tablet  send to [facility] last filled 5/14//24

## 2022-07-18 ENCOUNTER — Encounter: Payer: Self-pay | Admitting: Internal Medicine

## 2022-07-21 MED ORDER — TRAMADOL HCL 50 MG PO TABS: 50.0000 mg | ORAL_TABLET | Freq: Two times a day (BID) | ORAL | 3 refills | Status: DC | PRN

## 2022-08-07 ENCOUNTER — Ambulatory Visit: Payer: PPO | Admitting: Gastroenterology

## 2022-08-28 ENCOUNTER — Encounter: Payer: Self-pay | Admitting: Internal Medicine

## 2022-08-28 ENCOUNTER — Ambulatory Visit (INDEPENDENT_AMBULATORY_CARE_PROVIDER_SITE_OTHER): Payer: PPO | Admitting: Internal Medicine

## 2022-08-28 VITALS — BP 118/80 | HR 67 | Temp 99.3°F | Ht 59.0 in | Wt 154.0 lb

## 2022-08-28 DIAGNOSIS — R1319 Other dysphagia: Secondary | ICD-10-CM | POA: Diagnosis not present

## 2022-08-28 DIAGNOSIS — K219 Gastro-esophageal reflux disease without esophagitis: Secondary | ICD-10-CM | POA: Diagnosis not present

## 2022-08-28 DIAGNOSIS — K222 Esophageal obstruction: Secondary | ICD-10-CM | POA: Diagnosis not present

## 2022-08-28 MED ORDER — PANTOPRAZOLE SODIUM 40 MG PO TBEC: 40.0000 mg | DELAYED_RELEASE_TABLET | Freq: Two times a day (BID) | ORAL | 3 refills | Status: DC

## 2022-08-28 NOTE — Progress Notes (Signed)
Referring Provider: Tommie Sams, DO Primary Care Physician:  Tommie Sams, DO Primary GI:  Dr. Marletta Lor  Chief Complaint  Patient presents with   Gastroesophageal Reflux    Follow up on GERD. Has been doing well with swallowing and not having any issues currently.     HPI:   Jenna Booth is a 77 y.o. female who presents to the clinic today for follow-up visit.  I initially saw her in the hospital 10/19/2021 when she presented with esophageal food bolus.  Food bolus was successfully removed.  A very tight distal esophageal stricture noted dilated up to 12 mm.  Started on pantoprazole twice daily.  Repeat upper endoscopy 11/19/2021 with dilation up to 15 mm.  Since that time patient states she has been doing very well.  Had a few episodes of "mucus" regurgitating, otherwise doing well. States she does drink a lot of liquids with meals though this is difficult as it causes issues with urinary incontinence and nocturia.   No melena or hematochezia.   Past Medical History:  Diagnosis Date   Acid reflux    Anxiety    Cognitive impairment    Depression    Dizzy spells    GERD (gastroesophageal reflux disease)    Hyperlipidemia    Hypertension    Osteoarthritis    Osteoporosis    Raynaud's disease without gangrene     Past Surgical History:  Procedure Laterality Date   BALLOON DILATION  10/19/2021   Procedure: BALLOON DILATION;  Surgeon: Lanelle Bal, DO;  [facility];  Service: Endoscopy;;   BALLOON DILATION N/A 11/19/2021   Procedure: [place];  Surgeon: Lanelle Bal, DO;  [facility];  Service: Endoscopy;  Laterality: N/A;   CATARACT EXTRACTION W/PHACO Left 10/12/2012   Procedure: CATARACT EXTRACTION PHACO AND INTRAOCULAR LENS PLACEMENT (IOC);  Surgeon: Gemma Payor, MD;  Location: [facility];  Service: Ophthalmology;  Laterality: Left;  CDE:16.97   CRANIOTOMY Left 06/09/2012   Procedure: CRANIOTOMY HEMATOMA EVACUATION SUBDURAL;  Surgeon:  Karn Cassis, MD;  [facility]: [facility];  Service: Neurosurgery;  Laterality: Left;   ESOPHAGOGASTRODUODENOSCOPY (EGD) WITH PROPOFOL N/A 10/19/2021   Procedure: ESOPHAGOGASTRODUODENOSCOPY (EGD) WITH PROPOFOL;  Surgeon: Lanelle Bal, DO;  [facility];  Service: Endoscopy;  Laterality: N/A;   ESOPHAGOGASTRODUODENOSCOPY (EGD) WITH PROPOFOL N/A 11/19/2021   Procedure: ESOPHAGOGASTRODUODENOSCOPY (EGD) WITH PROPOFOL;  Surgeon: Lanelle Bal, DO;  [facility];  Service: Endoscopy;  Laterality: N/A;  11:15AM, ASA 3   IMPACTION REMOVAL  10/19/2021   Procedure: IMPACTION REMOVAL;  Surgeon: Lanelle Bal, DO;  [facility];  Service: Endoscopy;;   RHINOPLASTY      Current Outpatient Medications  Medication Sig Dispense Refill   acetaminophen (TYLENOL) 650 MG CR tablet Take 650 mg by mouth every 8 (eight) hours as needed for pain.     Camphor-Menthol-Methyl Sal (SALONPAS) 3.02-10-08 % PTCH Place 1 patch onto the skin daily as needed (pain).     loratadine (CLARITIN) 10 MG tablet Take 10 mg by mouth daily.     Magnesium 500 MG TABS Take 500 mg by mouth daily.     Misc Natural Products (GLUCOSAMINE CHOND MSM FORMULA) TABS Take 1 tablet by mouth in the morning, at noon, and at bedtime.     pantoprazole (PROTONIX) 40 MG tablet Take 1 tablet (40 mg total) by mouth 2 (two) times daily before a meal. 180 tablet 3  Polyethyl Glycol-Propyl Glycol (SYSTANE OP) Place 1 drop into both eyes at bedtime.     Probiotic Product (PROBIOTIC DAILY PO) Take 1 tablet by mouth daily.     sodium chloride (OCEAN) 0.65 % SOLN nasal spray Place 1 spray into both nostrils daily.     traMADol (ULTRAM) 50 MG tablet Take 1 tablet (50 mg total) by mouth every 12 (twelve) hours as needed for severe pain or moderate pain. 60 tablet 3   No current facility-administered medications for this visit.    Allergies as of 08/28/2022   (No Known Allergies)    Family History  Problem  Relation Age of Onset   Ulcerative colitis Mother    Thyroid disease Mother        goiter   Lung cancer Father    Cardiomyopathy Sister        takotsubu   Hyperlipidemia Sister    Hypertension Sister    Rheum arthritis Sister    Thyroid disease Maternal Grandfather        died during operation for a goiter   Other Paternal Grandmother        flu epidemic   Other Paternal Grandfather        flu epidemic    Social History   Socioeconomic History   Marital status: Widowed    Spouse name: Not on file   Number of children: 0   Years of education: Not on file   Highest education level: Some college, no degree  Occupational History    Comment: retired  Tobacco Use   Smoking status: Never    Passive exposure: Never   Smokeless tobacco: Never  Vaping Use   Vaping status: Never Used  Substance and Sexual Activity   Alcohol use: Never   Drug use: Never   Sexual activity: Not on file  Other Topics Concern   Not on file  Social History Narrative   01/18/19 lives alone   Caffeine- coffee 1 c daily   Social Determinants of Health   Financial Resource Strain: Not on file  Food Insecurity: Not on file  Transportation Needs: Not on file  Physical Activity: Not on file  Stress: Not on file  Social Connections: Not on file    Subjective: Review of Systems  Constitutional:  Negative for chills and fever.  HENT:  Negative for congestion and hearing loss.   Eyes:  Negative for blurred vision and double vision.  Respiratory:  Negative for cough and shortness of breath.   Cardiovascular:  Negative for chest pain and palpitations.  Gastrointestinal:  Negative for abdominal pain, blood in stool, constipation, diarrhea, melena and vomiting.       Dysphagia  Genitourinary:  Negative for dysuria and urgency.  Musculoskeletal:  Negative for joint pain and myalgias.  Skin:  Negative for itching and rash.  Neurological:  Negative for dizziness and headaches.  Psychiatric/Behavioral:   Negative for depression. The patient is not nervous/anxious.      Objective: BP 118/80 (BP Location: Left Arm, Patient Position: Sitting, Cuff Size: Large)   Pulse 67   Temp 99.3 F (37.4 C) (Oral)   Ht 4\' 11"  (1.499 m)   Wt 154 lb (69.9 kg)   BMI 31.10 kg/m  Physical Exam Constitutional:      Appearance: Normal appearance.  HENT:     Head: Normocephalic and atraumatic.  Eyes:     Extraocular Movements: Extraocular movements intact.     Conjunctiva/sclera: Conjunctivae normal.  Cardiovascular:  Rate and Rhythm: Normal rate and regular rhythm.  Pulmonary:     Effort: Pulmonary effort is normal.     Breath sounds: Normal breath sounds.  Abdominal:     General: Bowel sounds are normal.     Palpations: Abdomen is soft.  Musculoskeletal:        General: No swelling. Normal range of motion.     Cervical back: Normal range of motion and neck supple.  Skin:    General: Skin is warm and dry.     Coloration: Skin is not jaundiced.  Neurological:     General: No focal deficit present.     Mental Status: She is alert and oriented to person, place, and time.  Psychiatric:        Mood and Affect: Mood normal.        Behavior: Behavior normal.      Assessment: *Esophageal Dysphagia *Esophageal stricture *Chronic GERD *Hiatal hernia  Plan: Patient overall doing much better status post most recent esophageal dilation.  1 episode of dysphagia.  I think we can continue to monitor for now.  If this happens again, we will need to proceed with upper endoscopy with repeat esophageal dilation.  Continue on pantoprazole, counseled she can decrease down to once daily and see how she does.   Patient to avoid tough textures. All meats should be chopped finely. Eat slowly, take small bites, chew thoroughly, and drink plenty of liquids throughout meals. Advised if something were to get hung in her esophagus and not come up or go down, she should proceed to the emergency room.  Follow-up  in 6 months  08/28/2022 1:29 PM   Disclaimer: This note was dictated with voice recognition software. Similar sounding words can inadvertently be transcribed and may not be corrected upon review.

## 2022-08-28 NOTE — Patient Instructions (Signed)
  I think we can continue to watch your difficulty swallowing for now.  If this happens again, then let us know and we will schedule you for upper endoscopy with repeat esophageal dilation.   Continue on pantoprazole, you can decrease down to once daily and see how you do.    Recommend you avoid tough textures. All meats should be chopped finely. Eat slowly, take small bites, chew thoroughly, and drink plenty of liquids throughout meals. If something were to get hung in your esophagus and not come up or go down, Then you should proceed to the emergency room.   Follow up in 6 months.    It is always a pleasure seeing you.   Dr. Marletta Lor

## 2022-08-29 ENCOUNTER — Ambulatory Visit: Payer: PPO | Admitting: Family Medicine

## 2022-09-20 ENCOUNTER — Ambulatory Visit (INDEPENDENT_AMBULATORY_CARE_PROVIDER_SITE_OTHER): Payer: PPO

## 2022-09-20 VITALS — Ht 60.0 in | Wt 138.0 lb

## 2022-09-20 DIAGNOSIS — Z0000 Encounter for general adult medical examination without abnormal findings: Secondary | ICD-10-CM

## 2022-09-20 DIAGNOSIS — Z780 Asymptomatic menopausal state: Secondary | ICD-10-CM

## 2022-09-20 NOTE — Progress Notes (Signed)
 Subjective:   Jenna Booth is a 77 y.o. female who presents for an Initial Medicare Annual Wellness Visit.  Visit Complete: Virtual  I connected with  Jenna Booth on 09/20/22 by a audio enabled telemedicine application and verified that I am speaking with the correct person using two identifiers.  Patient Location: Home  Provider Location: Home Office  I discussed the limitations of evaluation and management by telemedicine. The patient expressed understanding and agreed to proceed.  Patient Medicare AWV questionnaire was completed by the patient on 09/16/2022; I have confirmed that all information answered by patient is correct and no changes since this date.  Review of Systems       Objective:    Today's Vitals   09/20/22 0910  Weight: 138 lb (62.6 kg)  Height: 5' (1.524 m)   Body mass index is 26.95 kg/m.     09/20/2022    9:09 AM 11/14/2021    9:18 AM 10/18/2021    2:31 PM 01/21/2019    2:29 PM 01/24/2017    5:32 PM 10/12/2012    7:45 AM 06/12/2012    3:00 PM  Advanced Directives  Does Patient Have a Medical Advance Directive? No No No No No Patient does not have advance directive;Patient would not like information Patient does not have advance directive;Patient would like information  Would patient like information on creating a medical advance directive? No - Patient declined No - Patient declined No - Patient declined    Advance directive packet given  Pre-existing out of facility DNR order (yellow form or pink MOST form)      No No    Current Medications (verified) Outpatient Encounter Medications as of 09/20/2022  Medication Sig   acetaminophen (TYLENOL) 650 MG CR tablet Take 650 mg by mouth every 8 (eight) hours as needed for pain.   Camphor-Menthol-Methyl Sal (SALONPAS) 3.02-10-08 % PTCH Place 1 patch onto the skin daily as needed (pain).   loratadine (CLARITIN) 10 MG tablet Take 10 mg by mouth daily.   Magnesium 500 MG TABS Take 500 mg by mouth daily.    Misc Natural Products (GLUCOSAMINE CHOND MSM FORMULA) TABS Take 1 tablet by mouth in the morning, at noon, and at bedtime.   pantoprazole (PROTONIX) 40 MG tablet Take 1 tablet (40 mg total) by mouth 2 (two) times daily before a meal.   Polyethyl Glycol-Propyl Glycol (SYSTANE OP) Place 1 drop into both eyes at bedtime.   Probiotic Product (PROBIOTIC DAILY PO) Take 1 tablet by mouth daily.   sodium chloride (OCEAN) 0.65 % SOLN nasal spray Place 1 spray into both nostrils daily.   traMADol (ULTRAM) 50 MG tablet Take 1 tablet (50 mg total) by mouth every 12 (twelve) hours as needed for severe pain or moderate pain.   No facility-administered encounter medications on file as of 09/20/2022.    Allergies (verified) Patient has no known allergies.   History: Past Medical History:  Diagnosis Date   Acid reflux    Anxiety    Cognitive impairment    Depression    Dizzy spells    GERD (gastroesophageal reflux disease)    Hyperlipidemia    Hypertension    Osteoarthritis    Osteoporosis    Raynaud's disease without gangrene    Past Surgical History:  Procedure Laterality Date   BALLOON DILATION  10/19/2021   Procedure: BALLOON DILATION;  Surgeon: Lanelle Bal, DO;  [facility];  Service: Endoscopy;;   BALLOON DILATION N/A  11/19/2021   Procedure: BALLOON DILATION;  Surgeon: Lanelle Bal, DO;  [facility];  Service: Endoscopy;  Laterality: N/A;   CATARACT EXTRACTION W/PHACO Left 10/12/2012   Procedure: CATARACT EXTRACTION PHACO AND INTRAOCULAR LENS PLACEMENT (IOC);  Surgeon: Gemma Payor, MD;  Location: [facility];  Service: Ophthalmology;  Laterality: Left;  CDE:16.97   CRANIOTOMY Left 06/09/2012   Procedure: CRANIOTOMY HEMATOMA EVACUATION SUBDURAL;  Surgeon: Karn Cassis, MD;  [facility];  Service: Neurosurgery;  Laterality: Left;   ESOPHAGOGASTRODUODENOSCOPY (EGD) WITH PROPOFOL N/A 10/19/2021   Procedure: ESOPHAGOGASTRODUODENOSCOPY (EGD) WITH  PROPOFOL;  Surgeon: Lanelle Bal, DO;  [facility];  Service: Endoscopy;  Laterality: N/A;   ESOPHAGOGASTRODUODENOSCOPY (EGD) WITH PROPOFOL N/A 11/19/2021   Procedure: ESOPHAGOGASTRODUODENOSCOPY (EGD) WITH PROPOFOL;  Surgeon: Lanelle Bal, DO;  [facility];  Service: Endoscopy;  Laterality: N/A;  11:15AM, ASA 3   IMPACTION REMOVAL  10/19/2021   Procedure: IMPACTION REMOVAL;  Surgeon: Lanelle Bal, DO;  [facility];  Service: Endoscopy;;   RHINOPLASTY     Family History  Problem Relation Age of Onset   Ulcerative colitis Mother    Thyroid disease Mother        goiter   Early death Mother    Lung cancer Father    Cardiomyopathy Sister        takotsubu   Hyperlipidemia Sister    Hypertension Sister    Rheum arthritis Sister    Thyroid disease Maternal Grandfather        died during operation for a goiter   Other Paternal Grandmother        flu epidemic   Other Paternal Grandfather        flu epidemic   Social History   Socioeconomic History   Marital status: Widowed    Spouse name: Not on file   Number of children: 0   Years of education: Not on file   Highest education level: Some college, no degree  Occupational History    Comment: retired  Tobacco Use   Smoking status: Never    Passive exposure: Never   Smokeless tobacco: Never  Vaping Use   Vaping status: Never Used  Substance and Sexual Activity   Alcohol use: Never   Drug use: Never   Sexual activity: Not Currently    Birth control/protection: Post-menopausal  Other Topics Concern   Not on file  Social History Narrative   01/18/19 lives alone   Caffeine- coffee 1 c daily   Social Determinants of Health   Financial Resource Strain: Low Risk  (09/16/2022)   Overall Financial Resource Strain (CARDIA)    Difficulty of Paying Living Expenses: Not hard at all  Food Insecurity: No Food Insecurity (09/16/2022)   Hunger Vital Sign    Worried About Running Out of  Food in the Last Year: Never true    Ran Out of Food in the Last Year: Never true  Transportation Needs: No Transportation Needs (09/16/2022)   PRAPARE - Administrator, Civil Service (Medical): No    Lack of Transportation (Non-Medical): No  Physical Activity: Inactive (09/16/2022)   Exercise Vital Sign    Days of Exercise per Week: 0 days    Minutes of Exercise per Session: 0 min  Stress: No Stress Concern Present (09/16/2022)   [employer] of Occupational Health - Occupational Stress Questionnaire    Feeling of Stress : Only a little  Social Connections: Socially Isolated (  09/16/2022)   Social Connection and Isolation Panel [NHANES]    Frequency of Communication with Friends and Family: Three times a week    Frequency of Social Gatherings with Friends and Family: Once a week    Attends Religious Services: Never    Database administrator or Organizations: No    Attends Banker Meetings: Never    Marital Status: Widowed    Tobacco Counseling Counseling given: Yes   Clinical Intake:  Pre-visit preparation completed: Yes  Pain : No/denies pain     BMI - recorded: 26.95 Nutritional Status: BMI 25 -29 Overweight Nutritional Risks: None Diabetes: No  How often do you need to have someone help you when you read instructions, pamphlets, or other written materials from your doctor or pharmacy?: 1 - Never  Interpreter Needed?: No  Information entered by ::  Kiptyn Rafuse, CMA   Activities of Daily Living    09/16/2022    4:41 PM 11/14/2021    9:20 AM  In your present state of health, do you have any difficulty performing the following activities:  Hearing? 1   Vision? 0   Difficulty concentrating or making decisions? 1   Walking or climbing stairs? 1   Dressing or bathing? 0   Doing errands, shopping? 1 0  Preparing Food and eating ? Y   Using the Toilet? N   In the past six months, have you accidently leaked urine? Y   Do you have problems  with loss of bowel control? N   Managing your Medications? N   Managing your Finances? N   Housekeeping or managing your Housekeeping? N     Patient Care Team: Tommie Sams, DO as PCP - General (Family Medicine) Wyline Mood Dorothe Pea, MD as PCP - Cardiology (Cardiology)  Indicate any recent Medical Services you may have received from other than [facility] providers in the past year (date may be approximate).     Assessment:   This is a routine wellness examination for Jenna Booth.  Hearing/Vision screen Hearing Screening - Comments:: Patient denies any hearing difficulties.   Vision Screening - Comments:: Patient denies any vision difficulties   Dietary issues and exercise activities discussed:     Goals Addressed             This Visit's Progress    Patient Stated       Just making it through the year       Depression Screen    09/20/2022    9:13 AM 06/18/2022    9:30 AM 02/28/2022    1:40 PM  PHQ 2/9 Scores  PHQ - 2 Score 3 1 6   PHQ- 9 Score 6 5 9     Fall Risk    09/16/2022    4:41 PM 02/28/2022    1:39 PM 08/28/2021    1:52 PM 01/19/2019    3:24 PM  Fall Risk   Falls in the past year? 1 0 1 0  Number falls in past yr: 0 0 1   Injury with Fall? 0 0 1   Risk for fall due to : Impaired balance/gait;Impaired mobility;History of fall(s) Impaired balance/gait;Impaired mobility Impaired balance/gait   Follow up Education provided;Falls prevention discussed Falls evaluation completed;Education provided Falls evaluation completed;Education provided;Falls prevention discussed     MEDICARE RISK AT HOME:   TIMED UP AND GO:  Was the test performed? No    Cognitive Function:    01/19/2019    3:25 PM  MMSE - Mini Mental  State Exam  Orientation to time 5  Orientation to Place 4  Registration 3  Attention/ Calculation 1  Recall 2  Language- name 2 objects 2  Language- repeat 1  Language- follow 3 step command 3  Language- read & follow direction 1  Write a sentence 1   Copy design 1  Total score 24        09/20/2022    9:13 AM  6CIT Screen  What Year? 0 points  What month? 0 points  What time? 0 points  Count back from 20 0 points  Months in reverse 0 points  Repeat phrase 0 points  Total Score 0 points    Immunizations Immunization History  Administered Date(s) Administered   Influenza-Unspecified 11/04/2017   Moderna SARS-COV2 Booster Vaccination 12/02/2019   PNEUMOCOCCAL CONJUGATE-20 02/28/2022   Tdap 01/21/2019    TDAP status: Up to date  Flu Vaccine status: Due, Education has been provided regarding the importance of this vaccine. Advised may receive this vaccine at local pharmacy or Health Dept. Aware to provide a copy of the vaccination record if obtained from local pharmacy or Health Dept. Verbalized acceptance and understanding.  Pneumococcal vaccine status: Up to date  Covid-19 vaccine status: Information provided on how to obtain vaccines.   Qualifies for Shingles Vaccine? Yes   Zostavax completed No   Shingrix Completed?: No.    Education has been provided regarding the importance of this vaccine. Patient has been advised to call insurance company to determine out of pocket expense if they have not yet received this vaccine. Advised may also receive vaccine at local pharmacy or Health Dept. Verbalized acceptance and understanding.  Screening Tests Health Maintenance  Topic Date Due   Zoster Vaccines- Shingrix (1 of 2) Never done   INFLUENZA VACCINE  09/05/2022   Medicare Annual Wellness (AWV)  09/20/2023   DTaP/Tdap/Td (2 - Td or Tdap) 01/20/2029   Pneumonia Vaccine 76+ Years old  Completed   DEXA SCAN  Completed   HPV VACCINES  Aged Out   COVID-19 Vaccine  Discontinued   Hepatitis C Screening  Discontinued    Health Maintenance  Health Maintenance Due  Topic Date Due   Zoster Vaccines- Shingrix (1 of 2) Never done   INFLUENZA VACCINE  09/05/2022    Colorectal Cancer Screening: Patient refused referral to GI  for a colonoscopy     Mammogram Status: Patient declined  Bone Density status: Ordered 09/20/2022. Pt provided with contact info and advised to call to schedule appt. Patient's last Dexa Scan showed osteoporosis on 04/08/2016  Lung Cancer Screening: (Low Dose CT Chest recommended if Age 61-80 years, 20 pack-year currently smoking OR have quit w/in 15years.) does not qualify.    Additional Screening:  Hepatitis C Screening: does not qualify; medical decision to discontinue this   Vision Screening: Recommended annual ophthalmology exams for early detection of glaucoma and other disorders of the eye. Is the patient up to date with their annual eye exam?  Yes  Who is the provider or what is the name of the office in which the patient attends annual eye exams? Dr. Charise Killian [facility] If pt is not established with a provider, would they like to be referred to a provider to establish care? No .   Dental Screening: Recommended annual dental exams for proper oral hygiene  Diabetic Foot Exam: n/a  Community Resource Referral / Chronic Care Management: CRR required this visit?  No   CCM required this visit?  No  Plan:     I have personally reviewed and noted the following in the patient's chart:   Medical and social history Use of alcohol, tobacco or illicit drugs  Current medications and supplements including opioid prescriptions. Patient is currently taking opioid prescriptions. Information provided to patient regarding non-opioid alternatives. Patient advised to discuss non-opioid treatment plan with their provider. Functional ability and status Nutritional status Physical activity Advanced directives List of other physicians Hospitalizations, surgeries, and ER visits in previous 12 months Vitals Screenings to include cognitive, depression, and falls Referrals and appointments  In addition, I have reviewed and discussed with patient certain preventive protocols, quality  metrics, and best practice recommendations. A written personalized care plan for preventive services as well as general preventive health recommendations were provided to patient.     Jordan Hawks Shannell Mikkelsen, CMA   09/20/2022   After Visit Summary: (MyChart) Due to this being a telephonic visit, the after visit summary with patients personalized plan was offered to patient via MyChart   Nurse Notes:

## 2022-09-20 NOTE — Patient Instructions (Signed)
Jenna Booth , Thank you for taking time to come for your Medicare Wellness Visit. I appreciate your ongoing commitment to your health goals. Please review the following plan we discussed and let me know if I can assist you in the future.   These are the goals we discussed:  Goals      Patient Stated     Just making it through the year        This is a list of the screening recommended for you and due dates:  Health Maintenance  Topic Date Due   Zoster (Shingles) Vaccine (1 of 2) Never done   DEXA scan (bone density measurement)  04/09/2018   Flu Shot  09/05/2022   Medicare Annual Wellness Visit  09/20/2023   DTaP/Tdap/Td vaccine (2 - Td or Tdap) 01/20/2029   Pneumonia Vaccine  Completed   HPV Vaccine  Aged Out   COVID-19 Vaccine  Discontinued   Hepatitis C Screening  Discontinued    Advanced directives: Advance directive discussed with you today. Even though you declined this today, please call our office should you change your mind, and we can give you the proper paperwork for you to fill out. Advance care planning is a way to make decisions about medical care that fits your values in case you are ever unable to make these decisions for yourself.  Information on Advanced Care Planning can be found at [facility] Advance Health Care Directives Advance Health Care Directives ([email or web address]    Conditions/risks identified:  You have an order for:  []   2D Mammogram  []   3D Mammogram  [x]   Bone Density   []   Lung Cancer Screening  Please call for appointment:   [facility] at [facility] [address] [place], [place] [address] [phone]  Make sure to wear two-piece clothing.  No lotions powders or deodorants the day of the appointment Make sure to bring picture ID and insurance card.  Bring list of medications you are currently taking including any supplements.   Schedule your [facility] screening mammogram through MyChart!    Log into your MyChart account.  Go to 'Visit' (or 'Appointments' if on mobile App) --> Schedule an Appointment  Under 'Select a Reason for Visit' choose the Mammogram Screening option.  Complete the pre-visit questions and select the time and place that best fits your schedule.    Next appointment: VIRTUAL/TELEPHONE APPOINTMENT Follow up in one year for your annual wellness visit  September 26, 2023 at 9:00am telephone visit.    Preventive Care 84 Years and Older, Female Preventive care refers to lifestyle choices and visits with your health care provider that can promote health and wellness. What does preventive care include? A yearly physical exam. This is also called an annual well check. Dental exams once or twice a year. Routine eye exams. Ask your health care provider how often you should have your eyes checked. Personal lifestyle choices, including: Daily care of your teeth and gums. Regular physical activity. Eating a healthy diet. Avoiding tobacco and drug use. Limiting alcohol use. Practicing safe sex. Taking low-dose aspirin every day. Taking vitamin and mineral supplements as recommended by your health care provider. What happens during an annual well check? The services and screenings done by your health care provider during your annual well check will depend on your age, overall health, lifestyle risk factors, and family history of disease. Counseling  Your health care provider may ask you questions about  your: Alcohol use. Tobacco use. Drug use. Emotional well-being. Home and relationship well-being. Sexual activity. Eating habits. History of falls. Memory and ability to understand (cognition). Work and work Astronomer. Reproductive health. Screening  You may have the following tests or measurements: Height, weight, and BMI. Blood pressure. Lipid and cholesterol levels. These may be checked every 5 years, or more frequently if you are over 59 years  old. Skin check. Lung cancer screening. You may have this screening every year starting at age 67 if you have a 30-pack-year history of smoking and currently smoke or have quit within the past 15 years. Fecal occult blood test (FOBT) of the stool. You may have this test every year starting at age 66. Flexible sigmoidoscopy or colonoscopy. You may have a sigmoidoscopy every 5 years or a colonoscopy every 10 years starting at age 70. Hepatitis C blood test. Hepatitis B blood test. Sexually transmitted disease (STD) testing. Diabetes screening. This is done by checking your blood sugar (glucose) after you have not eaten for a while (fasting). You may have this done every 1-3 years. Bone density scan. This is done to screen for osteoporosis. You may have this done starting at age 10. Mammogram. This may be done every 1-2 years. Talk to your health care provider about how often you should have regular mammograms. Talk with your health care provider about your test results, treatment options, and if necessary, the need for more tests. Vaccines  Your health care provider may recommend certain vaccines, such as: Influenza vaccine. This is recommended every year. Tetanus, diphtheria, and acellular pertussis (Tdap, Td) vaccine. You may need a Td booster every 10 years. Zoster vaccine. You may need this after age 34. Pneumococcal 13-valent conjugate (PCV13) vaccine. One dose is recommended after age 79. Pneumococcal polysaccharide (PPSV23) vaccine. One dose is recommended after age 20. Talk to your health care provider about which screenings and vaccines you need and how often you need them. This information is not intended to replace advice given to you by your health care provider. Make sure you discuss any questions you have with your health care provider. Document Released: 02/17/2015 Document Revised: 10/11/2015 Document Reviewed: 11/22/2014 Elsevier Interactive Patient Education  2017 [employer].  Fall Prevention in the Home Falls can cause injuries. They can happen to people of all ages. There are many things you can do to make your home safe and to help prevent falls. What can I do on the outside of my home? Regularly fix the edges of walkways and driveways and fix any cracks. Remove anything that might make you trip as you walk through a door, such as a raised step or threshold. Trim any bushes or trees on the path to your home. Use bright outdoor lighting. Clear any walking paths of anything that might make someone trip, such as rocks or tools. Regularly check to see if handrails are loose or broken. Make sure that both sides of any steps have handrails. Any raised decks and porches should have guardrails on the edges. Have any leaves, snow, or ice cleared regularly. Use sand or salt on walking paths during winter. Clean up any spills in your garage right away. This includes oil or grease spills. What can I do in the bathroom? Use night lights. Install grab bars by the toilet and in the tub and shower. Do not use towel bars as grab bars. Use non-skid mats or decals in the tub or shower. If you need to sit down in  the shower, use a plastic, non-slip stool. Keep the floor dry. Clean up any water that spills on the floor as soon as it happens. Remove soap buildup in the tub or shower regularly. Attach bath mats securely with double-sided non-slip rug tape. Do not have throw rugs and other things on the floor that can make you trip. What can I do in the bedroom? Use night lights. Make sure that you have a light by your bed that is easy to reach. Do not use any sheets or blankets that are too big for your bed. They should not hang down onto the floor. Have a firm chair that has side arms. You can use this for support while you get dressed. Do not have throw rugs and other things on the floor that can make you trip. What can I do in the kitchen? Clean up any spills right  away. Avoid walking on wet floors. Keep items that you use a lot in easy-to-reach places. If you need to reach something above you, use a strong step stool that has a grab bar. Keep electrical cords out of the way. Do not use floor polish or wax that makes floors slippery. If you must use wax, use non-skid floor wax. Do not have throw rugs and other things on the floor that can make you trip. What can I do with my stairs? Do not leave any items on the stairs. Make sure that there are handrails on both sides of the stairs and use them. Fix handrails that are broken or loose. Make sure that handrails are as long as the stairways. Check any carpeting to make sure that it is firmly attached to the stairs. Fix any carpet that is loose or worn. Avoid having throw rugs at the top or bottom of the stairs. If you do have throw rugs, attach them to the floor with carpet tape. Make sure that you have a light switch at the top of the stairs and the bottom of the stairs. If you do not have them, ask someone to add them for you. What else can I do to help prevent falls? Wear shoes that: Do not have high heels. Have rubber bottoms. Are comfortable and fit you well. Are closed at the toe. Do not wear sandals. If you use a stepladder: Make sure that it is fully opened. Do not climb a closed stepladder. Make sure that both sides of the stepladder are locked into place. Ask someone to hold it for you, if possible. Clearly mark and make sure that you can see: Any grab bars or handrails. First and last steps. Where the edge of each step is. Use tools that help you move around (mobility aids) if they are needed. These include: Canes. Walkers. Scooters. Crutches. Turn on the lights when you go into a dark area. Replace any light bulbs as soon as they burn out. Set up your furniture so you have a clear path. Avoid moving your furniture around. If any of your floors are uneven, fix them. If there are any  pets around you, be aware of where they are. Review your medicines with your doctor. Some medicines can make you feel dizzy. This can increase your chance of falling. Ask your doctor what other things that you can do to help prevent falls. This information is not intended to replace advice given to you by your health care provider. Make sure you discuss any questions you have with your health care provider. Document Released:  11/17/2008 Document Revised: 06/29/2015 Document Reviewed: 02/25/2014 Elsevier Interactive Patient Education  2017 [employer].

## 2022-10-01 ENCOUNTER — Other Ambulatory Visit: Payer: Self-pay | Admitting: Family Medicine

## 2022-12-19 ENCOUNTER — Ambulatory Visit: Payer: PPO | Admitting: Family Medicine

## 2023-02-18 ENCOUNTER — Encounter: Payer: Self-pay | Admitting: Internal Medicine

## 2023-02-27 ENCOUNTER — Ambulatory Visit: Payer: PPO | Admitting: Gastroenterology

## 2023-03-11 ENCOUNTER — Encounter: Payer: Self-pay | Admitting: Gastroenterology

## 2023-03-11 ENCOUNTER — Ambulatory Visit: Payer: HMO | Admitting: Gastroenterology

## 2023-03-11 VITALS — BP 125/83 | HR 73 | Temp 98.6°F | Ht 60.0 in | Wt 155.6 lb

## 2023-03-11 DIAGNOSIS — R1310 Dysphagia, unspecified: Secondary | ICD-10-CM | POA: Diagnosis not present

## 2023-03-11 DIAGNOSIS — K222 Esophageal obstruction: Secondary | ICD-10-CM | POA: Insufficient documentation

## 2023-03-11 NOTE — Patient Instructions (Signed)
 Continue pantoprazole  once a day as you are doing.   I am glad you are doing well! Please call if you have any concerns in the meantime or feel like you need to have your esophagus stretched.  We will see you in 6 months!   It was a pleasure to see you today. I want to create trusting relationships with patients and provide genuine, compassionate, and quality care. I truly value your feedback, so please be on the lookout for a survey regarding your visit with me today. I appreciate your time in completing this!         Therisa MICAEL Stager, PhD, ANP-BC [facility]

## 2023-03-11 NOTE — Progress Notes (Signed)
 Gastroenterology Office Note     Primary Care Physician:  Cook, Jayce G, DO  Primary Gastroenterologist: Dr. Cindie   Chief Complaint   Chief Complaint  Patient presents with   Follow-up    Follow up on swallowing     History of Present Illness   PILAR WESTERGAARD is a 78 y.o. female presenting today with a history of GERD, esophageal stricture s/p dilations in the past, last seen about 6 months ago and now here for routine visit.    Dealing with long Covid. Prior to Covid was walking 2 miles 6 days a week. Dealing with fatigue, brain fog. States this is not new.   Has had 2 episodes since seeing us  about 6 months ago. Had 2 episodes of mucus. States this is how it started previously. Was able to drink water thereafter. No solid food dysphagia. She wants to hold off on early interval EGD at this time. She is well aware of signs/symptoms to watch for.   Taking pantoprazole  once a day now.    EGD Oct 2023: 4 cm hiatal hernia, benign-appearing stenosis s/p dilation, normal stomach and duodenum  Sept 2023 EGD: food bolus in esophagus s/p removal, stenosis s/p dilation     Past Medical History:  Diagnosis Date   Acid reflux    Anxiety    Cognitive impairment    Depression    Dizzy spells    GERD (gastroesophageal reflux disease)    Hyperlipidemia    Hypertension    Osteoarthritis    Osteoporosis    Raynaud's disease without gangrene     Past Surgical History:  Procedure Laterality Date   BALLOON DILATION  10/19/2021   Procedure: BALLOON DILATION;  Surgeon: Cindie Carlin POUR, DO;  Location: [facility];  Service: Endoscopy;;   BALLOON DILATION N/A 11/19/2021   Procedure: MERRILL HODGKIN;  Surgeon: Cindie Carlin POUR, DO;  Location: [facility];  Service: Endoscopy;  Laterality: N/A;   CATARACT EXTRACTION W/PHACO Left 10/12/2012   Procedure: CATARACT EXTRACTION PHACO AND INTRAOCULAR LENS PLACEMENT (IOC);  Surgeon: Cherene Mania, MD;  Location: [facility];  Service:  Ophthalmology;  Laterality: Left;  CDE:16.97   CRANIOTOMY Left 06/09/2012   Procedure: CRANIOTOMY HEMATOMA EVACUATION SUBDURAL;  Surgeon: Catalina CHRISTELLA Stains, MD;  Location: [facility];  Service: Neurosurgery;  Laterality: Left;   ESOPHAGOGASTRODUODENOSCOPY (EGD) WITH PROPOFOL  N/A 10/19/2021   Procedure: ESOPHAGOGASTRODUODENOSCOPY (EGD) WITH PROPOFOL ;  Surgeon: Cindie Carlin POUR, DO;  [facility]: [facility];  Service: Endoscopy;  Laterality: N/A;   ESOPHAGOGASTRODUODENOSCOPY (EGD) WITH PROPOFOL  N/A 11/19/2021   Procedure: ESOPHAGOGASTRODUODENOSCOPY (EGD) WITH PROPOFOL ;  Surgeon: Cindie Carlin POUR, DO;  Location: [facility];  Service: Endoscopy;  Laterality: N/A;  11:15AM, ASA 3   IMPACTION REMOVAL  10/19/2021   Procedure: IMPACTION REMOVAL;  Surgeon: Cindie Carlin POUR, DO;  Location: [facility];  Service: Endoscopy;;   RHINOPLASTY      Current Outpatient Medications  Medication Sig Dispense Refill   escitalopram  (LEXAPRO ) 20 MG tablet TAKE (1) TABLET BY MOUTH ONCE DAILY. 90 tablet 0   loratadine (CLARITIN) 10 MG tablet Take 10 mg by mouth daily.     Magnesium 500 MG TABS Take 500 mg by mouth daily.     pantoprazole  (PROTONIX ) 40 MG tablet Take 1 tablet (40 mg total) by mouth 2 (two) times daily before a meal. (Patient taking differently: Take 40 mg by mouth daily.) 180 tablet 3   Polyethyl Glycol-Propyl Glycol (SYSTANE OP) Place 1  drop into both eyes at bedtime.     Probiotic Product (PROBIOTIC DAILY PO) Take 1 tablet by mouth daily.     sodium chloride  (OCEAN) 0.65 % SOLN nasal spray Place 1 spray into both nostrils daily.     No current facility-administered medications for this visit.    Allergies as of 03/11/2023   (No Known Allergies)    Family History  Problem Relation Age of Onset   Ulcerative colitis Mother    Thyroid  disease Mother        goiter   Early death Mother    Lung cancer Father    Cardiomyopathy Sister        takotsubu   Hyperlipidemia Sister     Hypertension Sister    Rheum arthritis Sister    Thyroid  disease Maternal Grandfather        died during operation for a goiter   Other Paternal Grandmother        flu epidemic   Other Paternal Grandfather        flu epidemic    Social History   Socioeconomic History   Marital status: Widowed    Spouse name: Not on file   Number of children: 0   Years of education: Not on file   Highest education level: Some college, no degree  Occupational History    Comment: retired  Tobacco Use   Smoking status: Never    Passive exposure: Never   Smokeless tobacco: Never  Vaping Use   Vaping status: Never Used  Substance and Sexual Activity   Alcohol use: Never   Drug use: Never   Sexual activity: Not Currently    Birth control/protection: Post-menopausal  Other Topics Concern   Not on file  Social History Narrative   01/18/19 lives alone   Caffeine- coffee 1 c daily   Social Drivers of Corporate Investment Banker Strain: Low Risk  (09/16/2022)   Overall Financial Resource Strain (CARDIA)    Difficulty of Paying Living Expenses: Not hard at all  Food Insecurity: No Food Insecurity (09/16/2022)   Hunger Vital Sign    Worried About Running Out of Food in the Last Year: Never true    Ran Out of Food in the Last Year: Never true  Transportation Needs: No Transportation Needs (09/16/2022)   PRAPARE - Administrator, Civil Service (Medical): No    Lack of Transportation (Non-Medical): No  Physical Activity: Inactive (09/16/2022)   Exercise Vital Sign    Days of Exercise per Week: 0 days    Minutes of Exercise per Session: 0 min  Stress: No Stress Concern Present (09/16/2022)   [employer] of Occupational Health - Occupational Stress Questionnaire    Feeling of Stress : Only a little  Social Connections: Socially Isolated (09/16/2022)   Social Connection and Isolation Panel [NHANES]    Frequency of Communication with Friends and Family: Three times a week     Frequency of Social Gatherings with Friends and Family: Once a week    Attends Religious Services: Never    Database Administrator or Organizations: No    Attends Banker Meetings: Never    Marital Status: Widowed  Intimate Partner Violence: Not At Risk (09/20/2022)   Humiliation, Afraid, Rape, and Kick questionnaire    Fear of Current or Ex-Partner: No    Emotionally Abused: No    Physically Abused: No    Sexually Abused: No     Review of Systems  Gen: Denies any fever, chills, fatigue, weight loss, lack of appetite.  CV: Denies chest pain, heart palpitations, peripheral edema, syncope.  Resp: Denies shortness of breath at rest or with exertion. Denies wheezing or cough.  GI: Denies dysphagia or odynophagia. Denies jaundice, hematemesis, fecal incontinence. GU : Denies urinary burning, urinary frequency, urinary hesitancy MS: Denies joint pain, muscle weakness, cramps, or limitation of movement.  Derm: Denies rash, itching, dry skin Psych: Denies depression, anxiety, memory loss, and confusion Heme: Denies bruising, bleeding, and enlarged lymph nodes.   Physical Exam   BP (!) 140/89   Pulse 73   Temp 98.6 F (37 C)   Ht 5' (1.524 m)   Wt 155 lb 9.6 oz (70.6 kg)   BMI 30.39 kg/m  General:   Alert and oriented. Pleasant and cooperative. Well-nourished and well-developed.  Head:  Normocephalic and atraumatic. Eyes:  Without icterus Abdomen:  +BS, soft, non-tender and non-distended. No HSM noted. No guarding or rebound. No masses appreciated.  Rectal:  Deferred  Msk:  Symmetrical without gross deformities. Normal posture. Extremities:  Without edema. Neurologic:  Alert and  oriented x4;  grossly normal neurologically. Skin:  Intact without significant lesions or rashes. Psych:  Alert and cooperative. Normal mood and affect.   Assessment   DEONI COSEY is a delightful 78 y.o. female presenting today with a history of GERD, esophageal stricture s/p  dilations in the past, last seen about 6 months ago and now here for routine visit.   She has had 2 episodes just after visit in July 2024, but otherwise has done quite well. She notes no solid food dysphagia. Pantoprazole  has been weaned down to once a day. She is appropriate for monitoring conservatively now; she is well versed  in signs/symptoms that would necessitate calling us  or going to the ED.     PLAN    Continue PPI once daily 6 month follow-up   Therisa MICAEL Stager, PhD, ANP-BC [facility]

## 2023-03-15 ENCOUNTER — Other Ambulatory Visit: Payer: Self-pay | Admitting: Family Medicine

## 2023-09-04 ENCOUNTER — Encounter: Payer: Self-pay | Admitting: Gastroenterology

## 2023-09-24 ENCOUNTER — Encounter: Payer: Self-pay | Admitting: Family Medicine

## 2023-09-24 ENCOUNTER — Ambulatory Visit (INDEPENDENT_AMBULATORY_CARE_PROVIDER_SITE_OTHER): Admitting: Family Medicine

## 2023-09-24 VITALS — BP 122/84 | HR 70 | Temp 98.1°F | Ht 60.0 in | Wt 160.0 lb

## 2023-09-24 DIAGNOSIS — Z0000 Encounter for general adult medical examination without abnormal findings: Secondary | ICD-10-CM | POA: Diagnosis not present

## 2023-09-24 NOTE — Patient Instructions (Signed)
 Consider physical therapy.  Follow up annually.

## 2023-09-25 ENCOUNTER — Encounter: Payer: Self-pay | Admitting: Family Medicine

## 2023-09-25 DIAGNOSIS — Z0000 Encounter for general adult medical examination without abnormal findings: Secondary | ICD-10-CM | POA: Insufficient documentation

## 2023-09-25 NOTE — Progress Notes (Signed)
 Subjective:  Patient ID: Jenna Booth, female    DOB: 1945/03/05  Age: 78 y.o. MRN: [id number]  CC:   Chief Complaint  Patient presents with   Annual Exam    HPI:  78 year old female with the below mentioned medical problems presents for an annual physical exam.  Patient states that she essentially does not leave the house except to come to the doctor.  She continues to have difficulty with chronic fatigue and dizziness.  She also states that she has brain fog.  She attributes all of her symptoms to long COVID.  Blood pressure is well-controlled.  Patient is not interested in any screenings/preventative healthcare items.  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 08/29/2021   Dizziness 08/29/2021   Memory difficulties 08/29/2021   Osteoporosis 08/29/2021   Anxiety and depression 08/29/2021   Hyperlipidemia 08/28/2021    Social Hx   Social History   Socioeconomic History   Marital status: Widowed    Spouse name: Not on file   Number of children: 0   Years of education: Not on file   Highest education level: Some college, no degree  Occupational History    Comment: retired  Tobacco Use   Smoking status: Never    Passive exposure: Never   Smokeless tobacco: Never  Vaping Use   Vaping status: Never Used  Substance and Sexual Activity   Alcohol use: Never   Drug use: Never   Sexual activity: Not Currently    Birth control/protection: Post-menopausal  Other Topics Concern   Not on file  Social History Narrative   01/18/19 lives alone   Caffeine- coffee 1 c daily   Social Drivers of Corporate investment banker Strain: Low Risk  (09/21/2023)   Overall Financial Resource Strain (CARDIA)    Difficulty of Paying Living Expenses: Not  hard at all  Food Insecurity: No Food Insecurity (09/21/2023)   Hunger Vital Sign    Worried About Running Out of Food in the Last Year: Never true    Ran Out of Food in the Last Year: Never true  Transportation Needs: No Transportation Needs (09/21/2023)   PRAPARE - Administrator, Civil Service (Medical): No    Lack of Transportation (Non-Medical): No  Physical Activity: Inactive (09/21/2023)   Exercise Vital Sign    Days of Exercise per Week: 0 days    Minutes of Exercise per Session: Not on file  Stress: Stress Concern Present (09/21/2023)   [employer] of Occupational Health - Occupational Stress Questionnaire    Feeling of Stress: To some extent  Social Connections: Socially Isolated (09/21/2023)   Social Connection and Isolation Panel    Frequency of Communication with Friends and Family: Three times a week    Frequency of Social Gatherings with Friends and Family: Once a week    Attends Religious Services: Never    Database administrator or Organizations: No    Attends Engineer, structural: Not on file    Marital Status: Widowed    Review of Systems Per HPI  Objective:  BP 122/84   Pulse 70   Temp 98.1 F (36.7 C)   Ht 5' (1.524 m)   Wt 160 lb (72.6 kg)   SpO2 95%   BMI  31.25 kg/m      09/24/2023    1:36 PM 03/11/2023    3:13 PM 03/11/2023    2:27 PM  BP/Weight  Systolic BP 122 125 140  Diastolic BP 84 83 89  Wt. (Lbs) 160  155.6  BMI 31.25 kg/m2  30.39 kg/m2    Physical Exam Vitals and nursing note reviewed.  Constitutional:      General: She is not in acute distress.    Appearance: Normal appearance.  HENT:     Head: Normocephalic and atraumatic.  Eyes:     General:        Right eye: No discharge.        Left eye: No discharge.     Conjunctiva/sclera: Conjunctivae normal.  Cardiovascular:     Rate and Rhythm: Normal rate and regular rhythm.  Pulmonary:     Effort: Pulmonary effort is normal.     Breath sounds: Normal breath  sounds. No wheezing, rhonchi or rales.  Neurological:     Mental Status: She is alert.  Psychiatric:        Mood and Affect: Mood normal.        Behavior: Behavior normal.     Lab Results  Component Value Date   WBC 6.6 02/14/2022   HGB 13.5 02/14/2022   HCT 41.1 02/14/2022   PLT 245 02/14/2022   GLUCOSE 113 (H) 02/14/2022   CHOL 389 (H) 02/14/2022   TRIG 118 02/14/2022   HDL 105 02/14/2022   LDLCALC 264 (H) 02/14/2022   ALT 10 02/14/2022   AST 18 02/14/2022   NA 136 02/14/2022   K 4.1 02/14/2022   CL 96 02/14/2022   CREATININE 0.93 02/14/2022   BUN 24 02/14/2022   CO2 22 02/14/2022   TSH 4.030 02/14/2022   INR 1.00 06/09/2012     Assessment & Plan:  Annual physical exam Assessment & Plan: We discussed preventative healthcare items that are recommended.  Patient declines.   Recommended physical therapy regarding dizziness as well as chronic fatigue/deconditioning.  Patient states that she will consider.     Follow-up: Annually  Felecia Stanfill DO [facility]

## 2023-09-25 NOTE — Assessment & Plan Note (Signed)
 We discussed preventative healthcare items that are recommended.  Patient declines.   Recommended physical therapy regarding dizziness as well as chronic fatigue/deconditioning.  Patient states that she will consider.

## 2023-09-26 ENCOUNTER — Ambulatory Visit (INDEPENDENT_AMBULATORY_CARE_PROVIDER_SITE_OTHER): Payer: PPO

## 2023-09-26 VITALS — Ht 60.0 in | Wt 160.0 lb

## 2023-09-26 DIAGNOSIS — Z0000 Encounter for general adult medical examination without abnormal findings: Secondary | ICD-10-CM | POA: Diagnosis not present

## 2023-09-26 NOTE — Patient Instructions (Signed)
 Jenna Booth , Thank you for taking time out of your busy schedule to complete your Annual Wellness Visit with me. I enjoyed our conversation and look forward to speaking with you again next year. I, as well as your care team,  appreciate your ongoing commitment to your health goals. Please review the following plan we discussed and let me know if I can assist you in the future. Your Game plan/ To Do List     Follow up Visits: We will see or speak with you next year for your Next Medicare AWV with our clinical staff Have you seen your provider in the last 6 months (3 months if uncontrolled diabetes)? Yes  Clinician Recommendations:  Aim for 30 minutes of exercise or brisk walking, 6-8 glasses of water, and 5 servings of fruits and vegetables each day.       This is a list of the screenings recommended for you:  Health Maintenance  Topic Date Due   Zoster (Shingles) Vaccine (1 of 2) 12/25/2023*   Flu Shot  05/04/2024*   DEXA scan (bone density measurement)  09/23/2024*   Medicare Annual Wellness Visit  09/25/2024   DTaP/Tdap/Td vaccine (2 - Td or Tdap) 01/20/2029   Pneumococcal Vaccine for age over 76  Completed   HPV Vaccine  Aged Out   Meningitis B Vaccine  Aged Out   COVID-19 Vaccine  Discontinued   Hepatitis C Screening  Discontinued  *Topic was postponed. The date shown is not the original due date.    Advanced directives: (ACP Link)Information on Advanced Care Planning can be found at [place]  Secretary of [facility] Advance Health Care Directives Advance Health Care Directives. [email or web address]   Advance Care Planning is important because it:  [x]  Makes sure you receive the medical care that is consistent with your values, goals, and preferences  [x]  It provides guidance to your family and loved ones and reduces their decisional burden about whether or not they are making the right decisions based on your wishes.  Follow the link provided in your after visit summary or read over  the paperwork we have mailed to you to help you started getting your Advance Directives in place. If you need assistance in completing these, please reach out to us  so that we can help you!  See attachments for Preventive Care and Fall Prevention Tips.

## 2023-09-26 NOTE — Progress Notes (Signed)
 Subjective:   Jenna Booth is a 78 y.o. who presents for a Medicare Wellness preventive visit.  As a reminder, Annual Wellness Visits don't include a physical exam, and some assessments may be limited, especially if this visit is performed virtually. We may recommend an in-person follow-up visit with your provider if needed.  Visit Complete: Virtual I connected with  Jenna Booth on 09/26/23 by a audio enabled telemedicine application and verified that I am speaking with the correct person using two identifiers.  Patient Location: Home  Provider Location: Home Office  I discussed the limitations of evaluation and management by telemedicine. The patient expressed understanding and agreed to proceed.  Vital Signs: Because this visit was a virtual/telehealth visit, some criteria may be missing or patient reported. Any vitals not documented were not able to be obtained and vitals that have been documented are patient reported.  VideoDeclined- This patient declined Librarian, academic. Therefore the visit was completed with audio only.  Persons Participating in Visit: Patient.  AWV Questionnaire: Yes: Patient Medicare AWV questionnaire was completed by the patient on 09/21/23; I have confirmed that all information answered by patient is correct and no changes since this date.  Cardiac Risk Factors include: advanced age (>50men, >62 women);dyslipidemia     Objective:    Today's Vitals   09/26/23 0904  Weight: 160 lb (72.6 kg)  Height: 5' (1.524 m)   Body mass index is 31.25 kg/m.     09/26/2023    9:07 AM 09/20/2022    9:09 AM 11/14/2021    9:18 AM 10/18/2021    2:31 PM 01/21/2019    2:29 PM 01/24/2017    5:32 PM 10/12/2012    7:45 AM  Advanced Directives  Does Patient Have a Medical Advance Directive? No No No No No No  Patient does not have advance directive;Patient would not like information   Would patient like information on creating a  medical advance directive? Yes (MAU/Ambulatory/Procedural Areas - Information given) No - Patient declined No - Patient declined No - Patient declined     Pre-existing out of facility DNR order (yellow form or pink MOST form)       No      Data saved with a previous flowsheet row definition    Current Medications (verified) Outpatient Encounter Medications as of 09/26/2023  Medication Sig   escitalopram  (LEXAPRO ) 20 MG tablet TAKE (1) TABLET BY MOUTH ONCE DAILY. (Patient taking differently: Take 10 mg by mouth daily.)   loratadine (CLARITIN) 10 MG tablet Take 10 mg by mouth daily.   Magnesium 500 MG TABS Take 500 mg by mouth daily.   pantoprazole  (PROTONIX ) 40 MG tablet Take 1 tablet (40 mg total) by mouth 2 (two) times daily before a meal.   Polyethyl Glycol-Propyl Glycol (SYSTANE OP) Place 1 drop into both eyes at bedtime.   Probiotic Product (PROBIOTIC DAILY PO) Take 1 tablet by mouth daily.   sodium chloride  (OCEAN) 0.65 % SOLN nasal spray Place 1 spray into both nostrils daily.   No facility-administered encounter medications on file as of 09/26/2023.    Allergies (verified) Patient has no known allergies.   History: Past Medical History:  Diagnosis Date   Acid reflux    Annual physical exam 09/25/2023   Anxiety    Cognitive impairment    Depression    Dizzy spells    GERD (gastroesophageal reflux disease)    Hyperlipidemia    Hypertension    Osteoarthritis  Osteoporosis    Raynaud's disease without gangrene    Past Surgical History:  Procedure Laterality Date   BALLOON DILATION  10/19/2021   Procedure: BALLOON DILATION;  Surgeon: Cindie Carlin POUR, DO;  [facility];  Service: Endoscopy;;   BALLOON DILATION N/A 11/19/2021   Procedure: MERRILL HODGKIN;  Surgeon: Cindie Carlin POUR, DO;  [facility]: [facility];  Service: Endoscopy;  Laterality: N/A;   CATARACT EXTRACTION W/PHACO Left 10/12/2012   Procedure: CATARACT EXTRACTION PHACO AND INTRAOCULAR LENS  PLACEMENT (IOC);  Surgeon: Cherene Mania, MD;  Location: [facility];  Service: Ophthalmology;  Laterality: Left;  CDE:16.97   CRANIOTOMY Left 06/09/2012   Procedure: CRANIOTOMY HEMATOMA EVACUATION SUBDURAL;  Surgeon: Catalina CHRISTELLA Stains, MD;  Location: [facility];  Service: Neurosurgery;  Laterality: Left;   ESOPHAGOGASTRODUODENOSCOPY (EGD) WITH PROPOFOL  N/A 10/19/2021   Procedure: ESOPHAGOGASTRODUODENOSCOPY (EGD) WITH PROPOFOL ;  Surgeon: Cindie Carlin POUR, DO;  [facility];  Service: Endoscopy;  Laterality: N/A;   ESOPHAGOGASTRODUODENOSCOPY (EGD) WITH PROPOFOL  N/A 11/19/2021   Procedure: ESOPHAGOGASTRODUODENOSCOPY (EGD) WITH PROPOFOL ;  Surgeon: Cindie Carlin POUR, DO;  [facility];  Service: Endoscopy;  Laterality: N/A;  11:15AM, ASA 3   IMPACTION REMOVAL  10/19/2021   Procedure: IMPACTION REMOVAL;  Surgeon: Cindie Carlin POUR, DO;  [facility]: [facility];  Service: Endoscopy;;   RHINOPLASTY     Family History  Problem Relation Age of Onset   Ulcerative colitis Mother    Thyroid  disease Mother        goiter   Early death Mother    Lung cancer Father    Alcohol abuse Father    Cancer Father    Cardiomyopathy Sister        takotsubu   Hyperlipidemia Sister    Hypertension Sister    Rheum arthritis Sister    Thyroid  disease Maternal Grandfather        died during operation for a goiter   Other Paternal Grandmother        flu epidemic   Other Paternal Grandfather        flu epidemic   Social History   Socioeconomic History   Marital status: Widowed    Spouse name: Not on file   Number of children: 0   Years of education: Not on file   Highest education level: Some college, no degree  Occupational History    Comment: retired  Tobacco Use   Smoking status: Never    Passive exposure: Never   Smokeless tobacco: Never  Vaping Use   Vaping status: Never Used  Substance and Sexual Activity   Alcohol use: Never   Drug use: Never   Sexual activity: Not Currently     Birth control/protection: Post-menopausal  Other Topics Concern   Not on file  Social History Narrative   01/18/19 lives alone   Caffeine- coffee 1 c daily   Social Drivers of Corporate investment banker Strain: Low Risk  (09/21/2023)   Overall Financial Resource Strain (CARDIA)    Difficulty of Paying Living Expenses: Not hard at all  Food Insecurity: No Food Insecurity (09/21/2023)   Hunger Vital Sign    Worried About Running Out of Food in the Last Year: Never true    Ran Out of Food in the Last Year: Never true  Transportation Needs: No Transportation Needs (09/21/2023)   PRAPARE - Administrator, Civil Service (Medical): No    Lack of Transportation (Non-Medical): No  Physical Activity: Inactive (  09/21/2023)   Exercise Vital Sign    Days of Exercise per Week: 0 days    Minutes of Exercise per Session: 0 min  Stress: Stress Concern Present (09/21/2023)   [employer] of Occupational Health - Occupational Stress Questionnaire    Feeling of Stress: To some extent  Social Connections: Socially Isolated (09/21/2023)   Social Connection and Isolation Panel    Frequency of Communication with Friends and Family: Three times a week    Frequency of Social Gatherings with Friends and Family: Once a week    Attends Religious Services: Never    Database administrator or Organizations: No    Attends Banker Meetings: Never    Marital Status: Widowed    Tobacco Counseling Counseling given: Not Answered    Clinical Intake:  Pre-visit preparation completed: Yes  Pain : No/denies pain  Diabetes: No   How often do you need to have someone help you when you read instructions, pamphlets, or other written materials from your doctor or pharmacy?: 1 - Never  Interpreter Needed?: No  Information entered by :: Charmaine Bloodgood LPN   Activities of Daily Living     09/26/2023    9:06 AM  In your present state of health, do you have any difficulty  performing the following activities:  Hearing? 0  Vision? 0  Difficulty concentrating or making decisions? 0  Walking or climbing stairs? 0  Dressing or bathing? 0  Doing errands, shopping? 0  Preparing Food and eating ? N  Using the Toilet? N  In the past six months, have you accidently leaked urine? N  Do you have problems with loss of bowel control? N  Managing your Medications? N  Managing your Finances? N  Housekeeping or managing your Housekeeping? N    Patient Care Team: Cook, Jayce G, DO as PCP - General (Family Medicine) Alvan Dorn FALCON, MD as PCP - Cardiology (Cardiology) Cindie Carlin POUR, DO as Consulting Physician (Gastroenterology)  I have updated your Care Teams any recent Medical Services you may have received from other providers in the past year.     Assessment:   This is a routine wellness examination for Jenna Booth.  Hearing/Vision screen Hearing Screening - Comments:: Denies hearing difficulties     Goals Addressed             This Visit's Progress    Maintain health and independence   On track      Depression Screen     09/26/2023    9:07 AM 09/24/2023    2:21 PM 09/20/2022    9:13 AM 06/18/2022    9:30 AM 02/28/2022    1:40 PM  PHQ 2/9 Scores  PHQ - 2 Score 1 1 3 1 6   PHQ- 9 Score 9 10 6 5 9     Fall Risk     09/26/2023    9:06 AM 09/24/2023    1:59 PM 09/16/2022    4:41 PM 02/28/2022    1:39 PM 08/28/2021    1:52 PM  Fall Risk   Falls in the past year? 1 1 1  0 1  Number falls in past yr: 1 1 0 0 1  Injury with Fall? 1 1 0 0 1  Risk for fall due to : History of fall(s);Impaired balance/gait;Impaired mobility History of fall(s) Impaired balance/gait;Impaired mobility;History of fall(s) Impaired balance/gait;Impaired mobility Impaired balance/gait  Follow up Education provided;Falls prevention discussed;Falls evaluation completed Falls evaluation completed Education provided;Falls prevention discussed Falls evaluation  completed;Education  provided Falls evaluation completed;Education provided;Falls prevention discussed      Data saved with a previous flowsheet row definition    MEDICARE RISK AT HOME:  Medicare Risk at Home Any stairs in or around the home?: No If so, are there any without handrails?: No Home free of loose throw rugs in walkways, pet beds, electrical cords, etc?: Yes Adequate lighting in your home to reduce risk of falls?: Yes Life alert?: No Use of a cane, walker or w/c?: No Grab bars in the bathroom?: Yes Shower chair or bench in shower?: No Elevated toilet seat or a handicapped toilet?: Yes  TIMED UP AND GO:  Was the test performed?  No  Cognitive Function: Declined/Normal: No cognitive concerns noted by patient or family. Patient alert, oriented, able to answer questions appropriately and recall recent events. No signs of memory loss or confusion.    01/19/2019    3:25 PM  MMSE - Mini Mental State Exam  Orientation to time 5  Orientation to Place 4  Registration 3  Attention/ Calculation 1  Recall 2  Language- name 2 objects 2  Language- repeat 1  Language- follow 3 step command 3  Language- read & follow direction 1  Write a sentence 1  Copy design 1  Total score 24        09/20/2022    9:13 AM  6CIT Screen  What Year? 0 points  What month? 0 points  What time? 0 points  Count back from 20 0 points  Months in reverse 0 points  Repeat phrase 0 points  Total Score 0 points    Immunizations Immunization History  Administered Date(s) Administered   Influenza-Unspecified 11/04/2017   Moderna SARS-COV2 Booster Vaccination 12/02/2019   PNEUMOCOCCAL CONJUGATE-20 02/28/2022   Tdap 01/21/2019    Screening Tests Health Maintenance  Topic Date Due   Zoster Vaccines- Shingrix (1 of 2) 12/25/2023 (Originally 01/13/1994)   INFLUENZA VACCINE  05/04/2024 (Originally 09/05/2023)   DEXA SCAN  09/23/2024 (Originally 04/09/2018)   Medicare Annual Wellness (AWV)  09/25/2024   DTaP/Tdap/Td  (2 - Td or Tdap) 01/20/2029   Pneumococcal Vaccine: 50+ Years  Completed   HPV VACCINES  Aged Out   Meningococcal B Vaccine  Aged Out   COVID-19 Vaccine  Discontinued   Hepatitis C Screening  Discontinued    Health Maintenance  There are no preventive care reminders to display for this patient.  Additional Screening:  Vision Screening: Recommended annual ophthalmology exams for early detection of glaucoma and other disorders of the eye. Would you like a referral to an eye doctor? No    Dental Screening: Recommended annual dental exams for proper oral hygiene  Community Resource Referral / Chronic Care Management: CRR required this visit?  No   CCM required this visit?  No   Plan:    I have personally reviewed and noted the following in the patient's chart:   Medical and social history Use of alcohol, tobacco or illicit drugs  Current medications and supplements including opioid prescriptions. Patient is not currently taking opioid prescriptions. Functional ability and status Nutritional status Physical activity Advanced directives List of other physicians Hospitalizations, surgeries, and ER visits in previous 12 months Vitals Screenings to include cognitive, depression, and falls Referrals and appointments  In addition, I have reviewed and discussed with patient certain preventive protocols, quality metrics, and best practice recommendations. A written personalized care plan for preventive services as well as general preventive health recommendations were provided to patient.  Lavelle Pfeiffer [place], [place]   1/77/7974   After Visit Summary: (MyChart) Due to this being a telephonic visit, the after visit summary with patients personalized plan was offered to patient via MyChart   Notes: Nothing significant to report at this time.

## 2023-11-02 ENCOUNTER — Other Ambulatory Visit: Payer: Self-pay | Admitting: Family Medicine

## 2023-11-05 ENCOUNTER — Other Ambulatory Visit (INDEPENDENT_AMBULATORY_CARE_PROVIDER_SITE_OTHER): Payer: Self-pay

## 2023-11-05 ENCOUNTER — Ambulatory Visit: Admitting: Orthopedic Surgery

## 2023-11-05 VITALS — BP 138/82 | HR 76 | Ht 60.0 in | Wt 160.0 lb

## 2023-11-05 DIAGNOSIS — W19XXXA Unspecified fall, initial encounter: Secondary | ICD-10-CM | POA: Diagnosis not present

## 2023-11-05 DIAGNOSIS — M545 Low back pain, unspecified: Secondary | ICD-10-CM

## 2023-11-05 DIAGNOSIS — M2550 Pain in unspecified joint: Secondary | ICD-10-CM | POA: Insufficient documentation

## 2023-11-05 DIAGNOSIS — M25552 Pain in left hip: Secondary | ICD-10-CM

## 2023-11-05 DIAGNOSIS — G8929 Other chronic pain: Secondary | ICD-10-CM

## 2023-11-05 DIAGNOSIS — I7300 Raynaud's syndrome without gangrene: Secondary | ICD-10-CM | POA: Insufficient documentation

## 2023-11-05 DIAGNOSIS — M160 Bilateral primary osteoarthritis of hip: Secondary | ICD-10-CM

## 2023-11-05 NOTE — Progress Notes (Signed)
  Intake history:  No chief complaint on file.    There were no vitals taken for this visit. There is no height or weight on file to calculate BMI.  Pharmacy? ______________________________________  WHAT ARE WE SEEING YOU FOR TODAY?   left lower back glut  How Demar Shad has this bothered you? (DOI?DOS?WS?)  Couple weeks  week(s) ago  Was there an injury? Yes- fell after taking a shower everything went black and I went down my brrother in law came to get me up and dropped me again. I have slept in a recliner for 14 years  Anticoag.  No  Diabetes No  Heart disease No  Hypertension No  SMOKING HX No  Kidney disease No overactive bladder  Any ALLERGIES ______________________________________________   Treatment:  Have you taken:  Tylenol  No  Advil No  Had PT No  Had injection No  Other  ______________Aleve___________

## 2023-11-05 NOTE — Progress Notes (Signed)
 Patient: Jenna Booth           Date of Birth: [birth year]           MRN: [id number] Visit Date: 11/05/2023 Requested by: Cook, Jayce G, DO [address] [place],  [place] [address] PCP: Cook, Jayce G, DO   Chief Complaint  Patient presents with   Back Pain    Left glut   Encounter Diagnoses  Name Primary?   Chronic left hip pain Yes   Lumbar pain     Plan:  Recommend she continue with current treatment of Aleve and Tylenol .  She wants to think about physical therapy as we offer that to her as a possible treatment  I think she is getting better but she is at risk for further issues because the spine is so diseased    Chief Complaint  Patient presents with   Back Pain    Left glut    HPI  78 year old female longstanding lower back and hip pain over 14 years fell twice once on her own and then when she was picked up off the floor she was dropped again.  Comes in with lower back pain rating down her left leg partially relieved with Aleve twice a day and Tylenol   Patient says she is getting better  Still having difficulty walking but that is chronic and no change noted when she started getting better    Body mass index is 31.25 kg/m.   Problem list, medical hx, medications and allergies reviewed   ROS bowel bladder function intact   No Known Allergies  BP 138/82   Pulse 76   Ht 5' (1.524 m)   Wt 160 lb (72.6 kg)   BMI 31.25 kg/m    Physical exam: Physical Exam Awake alert and oriented x 3  Mood affect normal  Gait  Currently using a cane  Hip range of motion normal leg lengths equal  Ortho Exam  MSK:  She has tenderness really in the lower back normal range of motion and leg lengths both hips negative straight leg raises normal reflexes normal sensation  Data reviewed:   Image(s) reviewed with personal interpretation:   DG Pelvis 1-2 Views Result Date: 11/05/2023 AP pelvis rule out hip pathology AP pelvis no fracture dislocation  mild narrowing both hips no changes in the femoral head Impression mild arthritis both hips   DG Lumbar Spine 2-3 Views Result Date: 11/05/2023 Spinal imaging for back pain The spine is osteopenic.  There are multiple levels of joint space narrowing degenerative disc disease changes such as osteophytes and endplate spurs The AP x-ray shows such deformity that the bone anatomy cannot be well-defined Impression severe spondylosis spinal stenosis with scoliosis     DG Pelvis 1-2 Views Result Date: 11/05/2023 AP pelvis rule out hip pathology AP pelvis no fracture dislocation mild narrowing both hips no changes in the femoral head Impression mild arthritis both hips   DG Lumbar Spine 2-3 Views Result Date: 11/05/2023 Spinal imaging for back pain The spine is osteopenic.  There are multiple levels of joint space narrowing degenerative disc disease changes such as osteophytes and endplate spurs The AP x-ray shows such deformity that the bone anatomy cannot be well-defined Impression severe spondylosis spinal stenosis with scoliosis     Assessment and plan:  Encounter Diagnoses  Name Primary?   Chronic left hip pain Yes   Lumbar pain

## 2023-12-30 ENCOUNTER — Other Ambulatory Visit: Payer: Self-pay

## 2023-12-30 DIAGNOSIS — R1319 Other dysphagia: Secondary | ICD-10-CM

## 2023-12-30 DIAGNOSIS — K222 Esophageal obstruction: Secondary | ICD-10-CM

## 2023-12-30 MED ORDER — PANTOPRAZOLE SODIUM 40 MG PO TBEC: 40.0000 mg | DELAYED_RELEASE_TABLET | Freq: Two times a day (BID) | ORAL | 1 refills | Status: AC

## 2024-10-01 ENCOUNTER — Ambulatory Visit
